# Patient Record
Sex: Female | Born: 1970 | Race: White | Hispanic: No | Smoking: Never smoker
Health system: Southern US, Community
[De-identification: ages and names within clinical notes are randomized; demographics above are authoritative.]

## PROBLEM LIST (undated history)

## (undated) DIAGNOSIS — R112 Nausea with vomiting, unspecified: Secondary | ICD-10-CM

## (undated) DIAGNOSIS — D3610 Benign neoplasm of peripheral nerves and autonomic nervous system, unspecified: Secondary | ICD-10-CM

## (undated) DIAGNOSIS — Z87442 Personal history of urinary calculi: Secondary | ICD-10-CM

## (undated) DIAGNOSIS — F4310 Post-traumatic stress disorder, unspecified: Secondary | ICD-10-CM

## (undated) DIAGNOSIS — E119 Type 2 diabetes mellitus without complications: Secondary | ICD-10-CM

## (undated) DIAGNOSIS — F32A Depression, unspecified: Secondary | ICD-10-CM

## (undated) DIAGNOSIS — F200 Paranoid schizophrenia: Secondary | ICD-10-CM

## (undated) DIAGNOSIS — G43909 Migraine, unspecified, not intractable, without status migrainosus: Secondary | ICD-10-CM

## (undated) DIAGNOSIS — F29 Unspecified psychosis not due to a substance or known physiological condition: Secondary | ICD-10-CM

## (undated) DIAGNOSIS — J45909 Unspecified asthma, uncomplicated: Secondary | ICD-10-CM

## (undated) DIAGNOSIS — F609 Personality disorder, unspecified: Secondary | ICD-10-CM

## (undated) DIAGNOSIS — Z9889 Other specified postprocedural states: Secondary | ICD-10-CM

## (undated) DIAGNOSIS — G919 Hydrocephalus, unspecified: Secondary | ICD-10-CM

## (undated) DIAGNOSIS — F319 Bipolar disorder, unspecified: Secondary | ICD-10-CM

## (undated) HISTORY — DX: Type 2 diabetes mellitus without complications: E11.9

## (undated) HISTORY — PX: TUBAL LIGATION: SHX77

---

## 2015-10-20 ENCOUNTER — Emergency Department (HOSPITAL_COMMUNITY): Payer: Medicaid Other

## 2015-10-20 ENCOUNTER — Emergency Department (HOSPITAL_COMMUNITY)
Admission: EM | Admit: 2015-10-20 | Discharge: 2015-10-20 | Disposition: A | Discharge status: Home / Self Care | Payer: Medicaid Other | Attending: Emergency Medicine | Admitting: Emergency Medicine

## 2015-10-20 ENCOUNTER — Encounter (HOSPITAL_COMMUNITY): Payer: Self-pay | Admitting: Emergency Medicine

## 2015-10-20 DIAGNOSIS — Z331 Pregnant state, incidental: Secondary | ICD-10-CM | POA: Diagnosis not present

## 2015-10-20 DIAGNOSIS — J45901 Unspecified asthma with (acute) exacerbation: Secondary | ICD-10-CM | POA: Diagnosis not present

## 2015-10-20 DIAGNOSIS — Z3A01 Less than 8 weeks gestation of pregnancy: Secondary | ICD-10-CM | POA: Insufficient documentation

## 2015-10-20 DIAGNOSIS — Z3201 Encounter for pregnancy test, result positive: Secondary | ICD-10-CM

## 2015-10-20 DIAGNOSIS — R0602 Shortness of breath: Secondary | ICD-10-CM | POA: Diagnosis present

## 2015-10-20 HISTORY — DX: Unspecified asthma, uncomplicated: J45.909

## 2015-10-20 LAB — CBC WITH DIFFERENTIAL/PLATELET
BASOS ABS: 0.1 10*3/uL (ref 0.0–0.1)
Basophils Relative: 0 %
EOS ABS: 0.5 10*3/uL (ref 0.0–0.7)
Eosinophils Relative: 4 %
HCT: 41.6 % (ref 36.0–46.0)
HEMOGLOBIN: 14 g/dL (ref 12.0–15.0)
LYMPHS ABS: 4.8 10*3/uL — AB (ref 0.7–4.0)
Lymphocytes Relative: 38 %
MCH: 29.8 pg (ref 26.0–34.0)
MCHC: 33.7 g/dL (ref 30.0–36.0)
MCV: 88.5 fL (ref 78.0–100.0)
Monocytes Absolute: 1.1 10*3/uL — ABNORMAL HIGH (ref 0.1–1.0)
Monocytes Relative: 9 %
NEUTROS PCT: 49 %
Neutro Abs: 6 10*3/uL (ref 1.7–7.7)
PLATELETS: 377 10*3/uL (ref 150–400)
RBC: 4.7 MIL/uL (ref 3.87–5.11)
RDW: 13.4 % (ref 11.5–15.5)
WBC: 12.5 10*3/uL — AB (ref 4.0–10.5)

## 2015-10-20 LAB — BLOOD GAS, VENOUS
ACID-BASE DEFICIT: 4.2 mmol/L — AB (ref 0.0–2.0)
Bicarbonate: 19.5 mEq/L — ABNORMAL LOW (ref 20.0–24.0)
O2 CONTENT: 2 L/min
O2 Saturation: 92.5 %
PATIENT TEMPERATURE: 37
PH VEN: 7.202 — AB (ref 7.250–7.300)
pCO2, Ven: 59.9 mmHg — ABNORMAL HIGH (ref 45.0–50.0)
pO2, Ven: 83.9 mmHg — ABNORMAL HIGH (ref 30.0–45.0)

## 2015-10-20 LAB — BLOOD GAS, ARTERIAL
ACID-BASE DEFICIT: 3.7 mmol/L — AB (ref 0.0–2.0)
Bicarbonate: 22.1 mEq/L (ref 20.0–24.0)
DRAWN BY: 277331
FIO2: 0.21
O2 SAT: 97.1 %
PATIENT TEMPERATURE: 37
pCO2 arterial: 27.1 mmHg — ABNORMAL LOW (ref 35.0–45.0)
pH, Arterial: 7.469 — ABNORMAL HIGH (ref 7.350–7.450)
pO2, Arterial: 91.2 mmHg (ref 80.0–100.0)

## 2015-10-20 LAB — BASIC METABOLIC PANEL
ANION GAP: 9 (ref 5–15)
BUN: 10 mg/dL (ref 6–20)
CHLORIDE: 103 mmol/L (ref 101–111)
CO2: 23 mmol/L (ref 22–32)
Calcium: 9 mg/dL (ref 8.9–10.3)
Creatinine, Ser: 0.75 mg/dL (ref 0.44–1.00)
Glucose, Bld: 177 mg/dL — ABNORMAL HIGH (ref 65–99)
POTASSIUM: 3.8 mmol/L (ref 3.5–5.1)
SODIUM: 135 mmol/L (ref 135–145)

## 2015-10-20 LAB — I-STAT BETA HCG BLOOD, ED (MC, WL, AP ONLY)

## 2015-10-20 MED ORDER — ALBUTEROL SULFATE HFA 108 (90 BASE) MCG/ACT IN AERS: 2.0000 | INHALATION_SPRAY | RESPIRATORY_TRACT | Status: AC | PRN

## 2015-10-20 MED ORDER — IPRATROPIUM-ALBUTEROL 0.5-2.5 (3) MG/3ML IN SOLN
RESPIRATORY_TRACT | Status: AC
  Administered 2015-10-20: 3 mL
  Filled 2015-10-20: qty 3

## 2015-10-20 MED ORDER — ALBUTEROL SULFATE HFA 108 (90 BASE) MCG/ACT IN AERS
2.0000 | INHALATION_SPRAY | Freq: Once | RESPIRATORY_TRACT | Status: AC
  Administered 2015-10-20: 2 via RESPIRATORY_TRACT
  Filled 2015-10-20: qty 6.7

## 2015-10-20 MED ORDER — METHYLPREDNISOLONE SODIUM SUCC 125 MG IJ SOLR
INTRAMUSCULAR | Status: AC
  Filled 2015-10-20: qty 2

## 2015-10-20 MED ORDER — ALBUTEROL SULFATE (2.5 MG/3ML) 0.083% IN NEBU
5.0000 mg | INHALATION_SOLUTION | Freq: Once | RESPIRATORY_TRACT | Status: DC
  Filled 2015-10-20: qty 6

## 2015-10-20 MED ORDER — IPRATROPIUM BROMIDE 0.02 % IN SOLN
0.5000 mg | Freq: Once | RESPIRATORY_TRACT | Status: DC
  Filled 2015-10-20: qty 2.5

## 2015-10-20 MED ORDER — PREDNISONE 10 MG PO TABS: 40.0000 mg | ORAL_TABLET | Freq: Every day | ORAL | Status: DC

## 2015-10-20 MED ORDER — METHYLPREDNISOLONE SODIUM SUCC 125 MG IJ SOLR
125.0000 mg | Freq: Once | INTRAMUSCULAR | Status: AC
  Administered 2015-10-20: 125 mg via INTRAVENOUS

## 2015-10-20 MED ORDER — MAGNESIUM SULFATE 2 GM/50ML IV SOLN
2.0000 g | Freq: Once | INTRAVENOUS | Status: AC
  Administered 2015-10-20: 2 g via INTRAVENOUS
  Filled 2015-10-20: qty 50

## 2015-10-20 MED ORDER — ALBUTEROL (5 MG/ML) CONTINUOUS INHALATION SOLN
10.0000 mg/h | INHALATION_SOLUTION | Freq: Once | RESPIRATORY_TRACT | Status: AC
  Administered 2015-10-20: 10 mg/h via RESPIRATORY_TRACT
  Filled 2015-10-20: qty 20

## 2015-10-20 NOTE — ED Notes (Signed)
PT presents to ED cold and clammy with SOB and wheezing inspiratory and expiratory. PT states hx of asthma but no inhaler and states is homeless. PT states tightness to chest. Respiratory paged.

## 2015-10-20 NOTE — ED Notes (Addendum)
Critical blood gas  Ph 7.20 C02:59.9 P02: 83.3 Bicarb: 19.5 Deficit: 4.2 Results reported to Dr. Oleta Mouse.

## 2015-10-20 NOTE — ED Notes (Signed)
MD at bedside. 

## 2015-10-20 NOTE — Discharge Instructions (Signed)
You can take albuterol every 3-4 hours as needed. Take your steroids as prescribed. Please return without fail for worsening symptoms including difficulty breathing, severe chest pain, passing out, or any other symptoms concerning to you.  Asthma, Adult Asthma is a condition of the lungs in which the airways tighten and narrow. Asthma can make it hard to breathe. Asthma cannot be cured, but medicine and lifestyle changes can help control it. Asthma may be started (triggered) by:  Animal skin flakes (dander).  Dust.  Cockroaches.  Pollen.  Mold.  Smoke.  Cleaning products.  Hair sprays or aerosol sprays.  Paint fumes or strong smells.  Cold air, weather changes, and winds.  Crying or laughing hard.  Stress.  Certain medicines or drugs.  Foods, such as dried fruit, potato chips, and sparkling grape juice.  Infections or conditions (colds, flu).  Exercise.  Certain medical conditions or diseases.  Exercise or tiring activities. HOME CARE   Take medicine as told by your doctor.  Use a peak flow meter as told by your doctor. A peak flow meter is a tool that measures how well the lungs are working.  Record and keep track of the peak flow meter's readings.  Understand and use the asthma action plan. An asthma action plan is a written plan for taking care of your asthma and treating your attacks.  To help prevent asthma attacks:  Do not smoke. Stay away from secondhand smoke.  Change your heating and air conditioning filter often.  Limit your use of fireplaces and wood stoves.  Get rid of pests (such as roaches and mice) and their droppings.  Throw away plants if you see mold on them.  Clean your floors. Dust regularly. Use cleaning products that do not smell.  Have someone vacuum when you are not home. Use a vacuum cleaner with a HEPA filter if possible.  Replace carpet with wood, tile, or vinyl flooring. Carpet can trap animal skin flakes and dust.  Use  allergy-proof pillows, mattress covers, and box spring covers.  Wash bed sheets and blankets every week in hot water and dry them in a dryer.  Use blankets that are made of polyester or cotton.  Clean bathrooms and kitchens with bleach. If possible, have someone repaint the walls in these rooms with mold-resistant paint. Keep out of the rooms that are being cleaned and painted.  Wash hands often. GET HELP IF:  You have make a whistling sound when breaking (wheeze), have shortness of breath, or have a cough even if taking medicine to prevent attacks.  The colored mucus you cough up (sputum) is thicker than usual.  The colored mucus you cough up changes from clear or white to yellow, green, gray, or bloody.  You have problems from the medicine you are taking such as:  A rash.  Itching.  Swelling.  Trouble breathing.  You need reliever medicines more than 2-3 times a week.  Your peak flow measurement is still at 50-79% of your personal best after following the action plan for 1 hour.  You have a fever. GET HELP RIGHT AWAY IF:   You seem to be worse and are not responding to medicine during an asthma attack.  You are short of breath even at rest.  You get short of breath when doing very little activity.  You have trouble eating, drinking, or talking.  You have chest pain.  You have a fast heartbeat.  Your lips or fingernails start to turn blue.  You  are light-headed, dizzy, or faint.  Your peak flow is less than 50% of your personal best.   This information is not intended to replace advice given to you by your health care provider. Make sure you discuss any questions you have with your health care provider.   Document Released: 04/14/2008 Document Revised: 07/18/2015 Document Reviewed: 05/26/2013 Elsevier Interactive Patient Education [employer].

## 2015-10-20 NOTE — ED Provider Notes (Signed)
CSN: [id number]     Arrival date & time 10/20/15  1318 History   First MD Initiated Contact with Patient 10/20/15 1342     Chief Complaint  Patient presents with  . Shortness of Breath     (Consider location/radiation/quality/duration/timing/severity/associated sxs/prior Treatment) HPI 44 year old female who presents with respiratory distress. History of asthma. History is provided by patient's significant other due to patient's respiratory distress. States that she has had some mild wheezing over the course of the past few days. Has had a recent cold, which she has now recovered from. After cleaning up in her room today, had progressively worsening shortness of breath and wheezing. Was brought to the ED for evaluation. No recent fevers, chills, chest pain, swelling in her legs or pain in her legs. Patient states that she does not have inhaler. Often do need steroids every 2-3 months. Has never needed hospitalization or intubations.   Past Medical History  Diagnosis Date  . Asthma    Past Surgical History  Procedure Laterality Date  . Cesarean section     History reviewed. No pertinent family history. Social History  Substance Use Topics  . Smoking status: Never Smoker   . Smokeless tobacco: None  . Alcohol Use: No   OB History    No data available     Review of Systems 10/14 systems reviewed and are negative other than those stated in the HPI    Allergies  Review of patient's allergies indicates no known allergies.  Home Medications   Prior to Admission medications   Medication Sig Start Date End Date Taking? Authorizing Provider  albuterol (PROVENTIL HFA;VENTOLIN HFA) 108 (90 BASE) MCG/ACT inhaler Inhale 2 puffs into the lungs every 4 (four) hours as needed for wheezing or shortness of breath. 10/20/15   Forde Dandy, MD  predniSONE (DELTASONE) 10 MG tablet Take 4 tablets (40 mg total) by mouth daily. 10/21/15   Forde Dandy, MD   BP 103/70 mmHg  Pulse 96   Temp(Src) 97.3 F (36.3 C) (Oral)  Resp 20  Ht 5\' 4"  (1.626 m)  Wt 240 lb (108.863 kg)  BMI 41.18 kg/m2  SpO2 96%  LMP 08/29/2015 Physical Exam Physical Exam  Nursing note and vitals reviewed. Constitutional: Diaphoretic, severe respiratory distress, ill appearing Head: Normocephalic and atraumatic.  Mouth/Throat: Oropharynx is clear and moist.  Neck: Normal range of motion. Neck supple.  Cardiovascular: Tachycardic rate and regular rhythm.  No edema.  Pulmonary/Chest: Severe respiratory distress, with inability to speak. Tachypnea, accessory muscle usage, poor aeration throughout, with scant wheezes due to poor air movement Abdominal: Soft. There is no tenderness. There is no rebound and no guarding.  Musculoskeletal: Normal range of motion.  Neurological: Alert, no facial droop, fluent speech, moves all extremities symmetrically Skin: Skin is warm and dry.  Psychiatric: Cooperative  ED Course  Procedures (including critical care time) Labs Review Labs Reviewed  BLOOD GAS, VENOUS - Abnormal; Notable for the following:    pH, Ven 7.202 (*)    pCO2, Ven 59.9 (*)    pO2, Ven 83.9 (*)    Bicarbonate 19.5 (*)    Acid-base deficit 4.2 (*)    All other components within normal limits  CBC WITH DIFFERENTIAL/PLATELET - Abnormal; Notable for the following:    WBC 12.5 (*)    Lymphs Abs 4.8 (*)    Monocytes Absolute 1.1 (*)    All other components within normal limits  BASIC METABOLIC PANEL - Abnormal; Notable for the following:  Glucose, Bld 177 (*)    All other components within normal limits  BLOOD GAS, ARTERIAL - Abnormal; Notable for the following:    pH, Arterial 7.469 (*)    pCO2 arterial 27.1 (*)    Acid-base deficit 3.7 (*)    All other components within normal limits  I-STAT BETA HCG BLOOD, ED ([facility], [facility], [facility] ONLY) - Abnormal; Notable for the following:    I-stat hCG, quantitative >2000.0 (*)    All other components within normal limits    Imaging Review Dg Chest 2  View  10/20/2015  CLINICAL DATA:  Shortness of breath and wheezing EXAM: CHEST  2 VIEW COMPARISON:  None. FINDINGS: Lungs are clear. Heart size and pulmonary vascularity are normal. No adenopathy. No bone lesions. IMPRESSION: No edema or consolidation. Electronically Signed   By: Lowella Grip III M.D.   On: 10/20/2015 15:15   I have personally reviewed and evaluated these images and lab results as part of my medical decision-making.   EKG Interpretation   Date/Time:  Saturday October 20 2015 13:39:22 EST Ventricular Rate:  126 PR Interval:  150 QRS Duration: 92 QT Interval:  309 QTC Calculation: 447 R Axis:   98 Text Interpretation:  Sinus tachycardia Probable left atrial enlargement  Borderline right axis deviation Minimal ST depression, inferior leads  Baseline wander No prior EKg for comparison Confirmed by LIU MD, DANA  [phone]) on 10/20/2015 1:43:22 PM       CRITICAL CARE Performed by: Forde Dandy   Total critical care time: 35 minutes  Critical care time was exclusive of separately billable procedures and treating other patients.  Critical care was necessary to treat or prevent imminent or life-threatening deterioration.  Critical care was time spent personally by me on the following activities: development of treatment plan with patient and/or surrogate as well as nursing, discussions with consultants, evaluation of patient's response to treatment, examination of patient, obtaining history from patient or surrogate, ordering and performing treatments and interventions, ordering and review of laboratory studies, ordering and review of radiographic studies, pulse oximetry and re-evaluation of patient's condition.   MDM   Final diagnoses:  Asthma exacerbation  Positive blood pregnancy test    In short, this is a 44 year old female with history of asthma who presents with severe respiratory distress. She on presentation is ill-appearing, diaphoretic, in severe  respiratory distress. He is unable to speak due to increased work of breathing, and overall has minimal breath sounds due with occasional expiratory wheeze. She is tachycardic and hypoxic on room air to 88%. Is given a duoneb followed by one hour of continuous albuterol. Received 2 g of magnesium sulfate and 125 mg of Solu-Medrol. Is noted to have initial significant improvement in symptoms after treatment with first duoneb, and during continuous albuterol is noted to have increased work of breathing, improvement in her conversational dyspnea, and improved air movement. CXR without infiltrate or other acute cardiopulmonary processes.  Initial VBG was concerning for a respiratory acidosis, but on my evaluation of her, she is alert and oriented, and showing response to treatment. An ABG was repeated shows more respiratory alkalosis from her tachypnea, and I'm not concerned for impending respiratory failure. She is incidentally found to be pregnant, as well, but no evidence of abdominal pain or vaginal bleeding. I do not feel that she requires imaging of her pregnancy today. She is given referral to [facility] for follow-up. Has clear lungs after 3 hour observation, with normal work of breathing and no  conversational dyspnea. Adequate for discharge home with outpatient treatment. If given albuterol inhaler as well as steroid burst. Low suspicion for PE despite pregnancy is full recovery with asthma treatments, and no other signs or symptoms of PE/DVT. Discussed strict return and follow-up instructions. She expressed understanding of all discharge instructions and felt comfortable to plan of care.    Forde Dandy, MD 10/20/15 [phone]

## 2015-10-20 NOTE — ED Notes (Signed)
Pt made aware to return if symptoms worsen or if any life threatening symptoms occur.   

## 2015-10-31 ENCOUNTER — Other Ambulatory Visit: Payer: Self-pay | Admitting: Obstetrics & Gynecology

## 2015-10-31 DIAGNOSIS — O3680X0 Pregnancy with inconclusive fetal viability, not applicable or unspecified: Secondary | ICD-10-CM

## 2015-11-01 ENCOUNTER — Ambulatory Visit (INDEPENDENT_AMBULATORY_CARE_PROVIDER_SITE_OTHER): Payer: Medicaid Other

## 2015-11-01 ENCOUNTER — Encounter: Payer: Self-pay | Admitting: *Deleted

## 2015-11-01 DIAGNOSIS — O3680X0 Pregnancy with inconclusive fetal viability, not applicable or unspecified: Secondary | ICD-10-CM | POA: Diagnosis not present

## 2015-11-01 DIAGNOSIS — Z3A01 Less than 8 weeks gestation of pregnancy: Secondary | ICD-10-CM | POA: Diagnosis not present

## 2015-11-01 NOTE — Progress Notes (Signed)
Dating [place] today.  Single IUP with CRL 6.9 mm which correlates to 6+[redacted] weeks GA.  FHR 121 bpm. Bilateral ovaries appear normal.

## 2015-11-01 NOTE — Progress Notes (Signed)
Patient ID: Sherry Cole, female   DOB: 26-Jan-1971, 44 y.o.   MRN: [id number] Pt here today for ultrasound only, had a few questions. Pt states she is having trouble with allergies what medication is safe at 6 weeks of pregnancy? Pt informed can take Tylenol or Benadryl. Pt states she is having to use and albuterol inhaler for wheezing frequently, hx of Asthma. Pt does not have cough or nasal drainage just wheezing. Pt also states she is homeless, lives in [place]. Asked pt if she had means of staying warm and food. Pt stated "working on that." Just moved to the area and is getting assistance. Informed pt if she had an episode of wheezing, unable to breath, and the albuterol inhaler was not affective she needed to go to ER. Pt verbalized understanding. Pt has her next appt on 11/14/2014 for New OB.

## 2015-11-15 ENCOUNTER — Other Ambulatory Visit (HOSPITAL_COMMUNITY)
Admission: RE | Admit: 2015-11-15 | Discharge: 2015-11-15 | Disposition: A | Discharge status: Home / Self Care | Payer: Medicaid Other | Source: Ambulatory Visit | Attending: Advanced Practice Midwife | Admitting: Advanced Practice Midwife

## 2015-11-15 ENCOUNTER — Encounter: Payer: Self-pay | Admitting: Advanced Practice Midwife

## 2015-11-15 ENCOUNTER — Ambulatory Visit (INDEPENDENT_AMBULATORY_CARE_PROVIDER_SITE_OTHER): Payer: Medicaid Other | Admitting: Advanced Practice Midwife

## 2015-11-15 VITALS — BP 100/60 | HR 76 | Wt 241.0 lb

## 2015-11-15 DIAGNOSIS — Z1151 Encounter for screening for human papillomavirus (HPV): Secondary | ICD-10-CM | POA: Diagnosis not present

## 2015-11-15 DIAGNOSIS — Z113 Encounter for screening for infections with a predominantly sexual mode of transmission: Secondary | ICD-10-CM | POA: Insufficient documentation

## 2015-11-15 DIAGNOSIS — O09519 Supervision of elderly primigravida, unspecified trimester: Secondary | ICD-10-CM | POA: Insufficient documentation

## 2015-11-15 DIAGNOSIS — Z0283 Encounter for blood-alcohol and blood-drug test: Secondary | ICD-10-CM

## 2015-11-15 DIAGNOSIS — Z331 Pregnant state, incidental: Secondary | ICD-10-CM

## 2015-11-15 DIAGNOSIS — Z01419 Encounter for gynecological examination (general) (routine) without abnormal findings: Secondary | ICD-10-CM | POA: Insufficient documentation

## 2015-11-15 DIAGNOSIS — Z369 Encounter for antenatal screening, unspecified: Secondary | ICD-10-CM

## 2015-11-15 DIAGNOSIS — O09521 Supervision of elderly multigravida, first trimester: Secondary | ICD-10-CM

## 2015-11-15 DIAGNOSIS — Z1389 Encounter for screening for other disorder: Secondary | ICD-10-CM

## 2015-11-15 DIAGNOSIS — O34219 Maternal care for unspecified type scar from previous cesarean delivery: Secondary | ICD-10-CM | POA: Insufficient documentation

## 2015-11-15 DIAGNOSIS — Z124 Encounter for screening for malignant neoplasm of cervix: Secondary | ICD-10-CM

## 2015-11-15 LAB — POCT URINALYSIS DIPSTICK
Glucose, UA: NEGATIVE
KETONES UA: NEGATIVE
Leukocytes, UA: NEGATIVE
Nitrite, UA: NEGATIVE
PROTEIN UA: NEGATIVE
RBC UA: NEGATIVE

## 2015-11-15 NOTE — Progress Notes (Signed)
  Subjective:    Sherry Cole is a G2P0 [redacted]w[redacted]d being seen today for her first obstetrical visit.  Her obstetrical history is significant for advanced maternal age.  Pregnancy history fully reviewed. She had a cs after failed IOL for PROM--wants repeat.  She lived in [place], bought a RV for $40 in [place] she, her husband, and her son have traveled since then, now plan to stay in [place]. Still living in the RV, wants to get a job.  Somewhat of a poor historian regarding health history "pregnant [redacted] weeks--EDC based on LMP rather than my pregnancy sx", "they always find cancer cells in my paps, but I never go back cause I know they weren't in my cervix."   Patient reports some fatigue and dizzy spells. Is dizzy sometime even when supine, has had vertigo in the past.  Dix-Hallpike test negative  Filed Vitals:   11/15/15 0848  BP: 100/60  Pulse: 76  Weight: 241 lb (109.317 kg)    HISTORY: OB History  Gravida Para Term Preterm AB SAB TAB Ectopic Multiple Living  2 1 1       1     # Outcome Date GA Lbr Len/2nd Weight Sex Delivery Anes PTL Lv  2 Current           1 Term 08/23/02 [redacted]w[redacted]d  10 lb (4.536 kg) M CS-LTranv Spinal N Y     Past Medical History  Diagnosis Date  . Asthma    Past Surgical History  Procedure Laterality Date  . Cesarean section     Family History  Problem Relation Age of Onset  . Heart disease Mother   . Heart disease Maternal Grandmother   . Cancer Maternal Grandmother   . Hyperlipidemia Maternal Grandmother      Exam       Pelvic Exam:    Perineum: Normal Perineum   Vulva: normal   Vagina:  normal mucosa, normal discharge, no palpable nodules   Uterus Normal, Gravid, FH: 8     Cervix: Normal.,  Pap collected from cervix   Adnexa: Not palpable   Urinary:  urethral meatus normal    System:     Skin: normal coloration and turgor, no rashes    Neurologic: oriented, normal, normal mood   Extremities: normal strength, tone, and muscle mass   HEENT  PERRLA   Mouth/Teeth mucous membranes moist, poor dentition   Neck supple and no masses   Cardiovascular: regular rate and rhythm   Respiratory:  appears well, vitals normal, no respiratory distress, acyanotic   Abdomen: soft, non-tender;  FHR: 150 [place]          Assessment:    Pregnancy: G2P0  AMA       Plan:     Initial labs drawn. Continue prenatal vitamins  Problem list reviewed and updated  Reviewed n/v relief measures and warning s/s to report  Reviewed recommended weight gain based on pre-gravid BMI  Encouraged well-balanced diet Genetic Screening discussed    : CFDNA vs amnio--requests CFDNA.  Ultrasound discussed; fetal survey: requested.  Return in about 4 weeks (around 12/13/2015) for HROB., CFDNA, start ASA 81mg   CRESENZO-DISHMAN,Arlie Posch 11/15/2015

## 2015-11-15 NOTE — Patient Instructions (Addendum)
Safe Medications in Pregnancy   Acne: Benzoyl Peroxide Salicylic Acid  Backache/Headache: Tylenol: 2 regular strength every 4 hours OR              2 Extra strength every 6 hours  Colds/Coughs/Allergies: Benadryl (alcohol free) 25 mg every 6 hours as needed Breath right strips Claritin Cepacol throat lozenges Chloraseptic throat spray Cold-Eeze- up to three times per day Cough drops, alcohol free Flonase (by prescription only) Guaifenesin Mucinex Robitussin DM (plain only, alcohol free) Saline nasal spray/drops Sudafed (pseudoephedrine) & Actifed ** use only after [redacted] weeks gestation and if you do not have high blood pressure Tylenol Vicks Vaporub Zinc lozenges Zyrtec   Constipation: Colace Ducolax suppositories Fleet enema Glycerin suppositories Metamucil Milk of magnesia Miralax Senokot Smooth move tea  Diarrhea: Kaopectate Imodium A-D  *NO pepto Bismol  Hemorrhoids: Anusol Anusol HC Preparation H Tucks  Indigestion: Tums Maalox Mylanta Zantac  Pepcid  Insomnia: Benadryl (alcohol free) 25mg every 6 hours as needed Tylenol PM Unisom, no Gelcaps  Leg Cramps: Tums MagGel  Nausea/Vomiting:  Bonine Dramamine Emetrol Ginger extract Sea bands Meclizine  Nausea medication to take during pregnancy:  Unisom (doxylamine succinate 25 mg tablets) Take one tablet daily at bedtime. If symptoms are not adequately controlled, the dose can be increased to a maximum recommended dose of two tablets daily (1/2 tablet in the morning, 1/2 tablet mid-afternoon and one at bedtime). Vitamin B6 100mg tablets. Take one tablet twice a day (up to 200 mg per day).  Skin Rashes: Aveeno products Benadryl cream or 25mg every 6 hours as needed Calamine Lotion 1% cortisone cream  Yeast infection: Gyne-lotrimin 7 Monistat 7   **If taking multiple medications, please check labels to avoid duplicating the same active ingredients **take medication as directed on  the label ** Do not exceed 4000 mg of tylenol in 24 hours **Do not take medications that contain aspirin or ibuprofen    First Trimester of Pregnancy The first trimester of pregnancy is from week 1 until the end of week 12 (months 1 through 3). A week after a sperm fertilizes an egg, the egg will implant on the wall of the uterus. This embryo will begin to develop into a baby. Genes from you and your partner are forming the baby. The female genes determine whether the baby is a boy or a girl. At 6-8 weeks, the eyes and face are formed, and the heartbeat can be seen on ultrasound. At the end of 12 weeks, all the baby's organs are formed.  Now that you are pregnant, you will want to do everything you can to have a healthy baby. Two of the most important things are to get good prenatal care and to follow your health care provider's instructions. Prenatal care is all the medical care you receive before the baby's birth. This care will help prevent, find, and treat any problems during the pregnancy and childbirth. BODY CHANGES Your body goes through many changes during pregnancy. The changes vary from woman to woman.   You may gain or lose a couple of pounds at first.  You may feel sick to your stomach (nauseous) and throw up (vomit). If the vomiting is uncontrollable, call your health care provider.  You may tire easily.  You may develop headaches that can be relieved by medicines approved by your health care provider.  You may urinate more often. Painful urination may mean you have a bladder infection.  You may develop heartburn as a result of your   pregnancy.  You may develop constipation because certain hormones are causing the muscles that push waste through your intestines to slow down.  You may develop hemorrhoids or swollen, bulging veins (varicose veins).  Your breasts may begin to grow larger and become tender. Your nipples may stick out more, and the tissue that surrounds them (areola)  may become darker.  Your gums may bleed and may be sensitive to brushing and flossing.  Dark spots or blotches (chloasma, mask of pregnancy) may develop on your face. This will likely fade after the baby is born.  Your menstrual periods will stop.  You may have a loss of appetite.  You may develop cravings for certain kinds of food.  You may have changes in your emotions from day to day, such as being excited to be pregnant or being concerned that something may go wrong with the pregnancy and baby.  You may have more vivid and strange dreams.  You may have changes in your hair. These can include thickening of your hair, rapid growth, and changes in texture. Some women also have hair loss during or after pregnancy, or hair that feels dry or thin. Your hair will most likely return to normal after your baby is born. WHAT TO EXPECT AT YOUR PRENATAL VISITS During a routine prenatal visit:  You will be weighed to make sure you and the baby are growing normally.  Your blood pressure will be taken.  Your abdomen will be measured to track your baby's growth.  The fetal heartbeat will be listened to starting around week 10 or 12 of your pregnancy.  Test results from any previous visits will be discussed. Your health care provider may ask you:  How you are feeling.  If you are feeling the baby move.  If you have had any abnormal symptoms, such as leaking fluid, bleeding, severe headaches, or abdominal cramping.  If you are using any tobacco products, including cigarettes, chewing tobacco, and electronic cigarettes.  If you have any questions. Other tests that may be performed during your first trimester include:  Blood tests to find your blood type and to check for the presence of any previous infections. They will also be used to check for low iron levels (anemia) and Rh antibodies. Later in the pregnancy, blood tests for diabetes will be done along with other tests if problems  develop.  Urine tests to check for infections, diabetes, or protein in the urine.  An ultrasound to confirm the proper growth and development of the baby.  An amniocentesis to check for possible genetic problems.  Fetal screens for spina bifida and Down syndrome.  You may need other tests to make sure you and the baby are doing well.  HIV (human immunodeficiency virus) testing. Routine prenatal testing includes screening for HIV, unless you choose not to have this test. HOME CARE INSTRUCTIONS  Medicines  Follow your health care provider's instructions regarding medicine use. Specific medicines may be either safe or unsafe to take during pregnancy.  Take your prenatal vitamins as directed.  If you develop constipation, try taking a stool softener if your health care provider approves. Diet  Eat regular, well-balanced meals. Choose a variety of foods, such as meat or vegetable-based protein, fish, milk and low-fat dairy products, vegetables, fruits, and whole grain breads and cereals. Your health care provider will help you determine the amount of weight gain that is right for you.  Avoid raw meat and uncooked cheese. These carry germs that can cause   birth defects in the baby.  Eating four or five small meals rather than three large meals a day may help relieve nausea and vomiting. If you start to feel nauseous, eating a few soda crackers can be helpful. Drinking liquids between meals instead of during meals also seems to help nausea and vomiting.  If you develop constipation, eat more high-fiber foods, such as fresh vegetables or fruit and whole grains. Drink enough fluids to keep your urine clear or pale yellow. Activity and Exercise  Exercise only as directed by your health care provider. Exercising will help you:  Control your weight.  Stay in shape.  Be prepared for labor and delivery.  Experiencing pain or cramping in the lower abdomen or low back is a good sign that you  should stop exercising. Check with your health care provider before continuing normal exercises.  Try to avoid standing for long periods of time. Move your legs often if you must stand in one place for a long time.  Avoid heavy lifting.  Wear low-heeled shoes, and practice good posture.  You may continue to have sex unless your health care provider directs you otherwise. Relief of Pain or Discomfort  Wear a good support bra for breast tenderness.   Take warm sitz baths to soothe any pain or discomfort caused by hemorrhoids. Use hemorrhoid cream if your health care provider approves.   Rest with your legs elevated if you have leg cramps or low back pain.  If you develop varicose veins in your legs, wear support hose. Elevate your feet for 15 minutes, 3-4 times a day. Limit salt in your diet. Prenatal Care  Schedule your prenatal visits by the twelfth week of pregnancy. They are usually scheduled monthly at first, then more often in the last 2 months before delivery.  Write down your questions. Take them to your prenatal visits.  Keep all your prenatal visits as directed by your health care provider. Safety  Wear your seat belt at all times when driving.  Make a list of emergency phone numbers, including numbers for family, friends, the hospital, and police and fire departments. General Tips  Ask your health care provider for a referral to a local prenatal education class. Begin classes no later than at the beginning of month 6 of your pregnancy.  Ask for help if you have counseling or nutritional needs during pregnancy. Your health care provider can offer advice or refer you to specialists for help with various needs.  Do not use hot tubs, steam rooms, or saunas.  Do not douche or use tampons or scented sanitary pads.  Do not cross your legs for long periods of time.  Avoid cat litter boxes and soil used by cats. These carry germs that can cause birth defects in the baby  and possibly loss of the fetus by miscarriage or stillbirth.  Avoid all smoking, herbs, alcohol, and medicines not prescribed by your health care provider. Chemicals in these affect the formation and growth of the baby.  Do not use any tobacco products, including cigarettes, chewing tobacco, and electronic cigarettes. If you need help quitting, ask your health care provider. You may receive counseling support and other resources to help you quit.  Schedule a dentist appointment. At home, brush your teeth with a soft toothbrush and be gentle when you floss. SEEK MEDICAL CARE IF:   You have dizziness.  You have mild pelvic cramps, pelvic pressure, or nagging pain in the abdominal area.  You have persistent   nausea, vomiting, or diarrhea.  You have a bad smelling vaginal discharge.  You have pain with urination.  You notice increased swelling in your face, hands, legs, or ankles. SEEK IMMEDIATE MEDICAL CARE IF:   You have a fever.  You are leaking fluid from your vagina.  You have spotting or bleeding from your vagina.  You have severe abdominal cramping or pain.  You have rapid weight gain or loss.  You vomit blood or material that looks like coffee grounds.  You are exposed to German measles and have never had them.  You are exposed to fifth disease or chickenpox.  You develop a severe headache.  You have shortness of breath.  You have any kind of trauma, such as from a fall or a car accident.   This information is not intended to replace advice given to you by your health care provider. Make sure you discuss any questions you have with your health care provider.   Document Released: 10/21/2001 Document Revised: 11/17/2014 Document Reviewed: 09/06/2013 Elsevier Interactive Patient Education 2016 [employer]  

## 2015-11-16 LAB — URINE CULTURE: Organism ID, Bacteria: NO GROWTH

## 2015-11-19 LAB — CYTOLOGY - PAP

## 2015-11-26 LAB — CBC
HEMATOCRIT: 38.5 % (ref 34.0–46.6)
Hemoglobin: 13.1 g/dL (ref 11.1–15.9)
MCH: 29.3 pg (ref 26.6–33.0)
MCHC: 34 g/dL (ref 31.5–35.7)
MCV: 86 fL (ref 79–97)
Platelets: 363 10*3/uL (ref 150–379)
RBC: 4.47 x10E6/uL (ref 3.77–5.28)
RDW: 13.3 % (ref 12.3–15.4)
WBC: 11.1 10*3/uL — AB (ref 3.4–10.8)

## 2015-11-26 LAB — PMP SCREEN PROFILE (10S), URINE
AMPHETAMINE SCRN UR: NEGATIVE ng/mL
BARBITURATE SCRN UR: NEGATIVE ng/mL
Benzodiazepine Screen, Urine: NEGATIVE ng/mL
CREATININE(CRT), U: 71.4 mg/dL (ref 20.0–300.0)
Cannabinoids Ur Ql Scn: NEGATIVE ng/mL
Cocaine(Metab.)Screen, Urine: NEGATIVE ng/mL
METHADONE SCREEN, URINE: NEGATIVE ng/mL
OPIATE SCRN UR: NEGATIVE ng/mL
OXYCODONE+OXYMORPHONE UR QL SCN: NEGATIVE ng/mL
PCP Scrn, Ur: NEGATIVE ng/mL
PROPOXYPHENE SCREEN: NEGATIVE ng/mL
Ph of Urine: 6.2 (ref 4.5–8.9)

## 2015-11-26 LAB — URINALYSIS, ROUTINE W REFLEX MICROSCOPIC
BILIRUBIN UA: NEGATIVE
GLUCOSE, UA: NEGATIVE
Ketones, UA: NEGATIVE
LEUKOCYTES UA: NEGATIVE
Nitrite, UA: NEGATIVE
PH UA: 6.5 (ref 5.0–7.5)
PROTEIN UA: NEGATIVE
RBC, UA: NEGATIVE
Specific Gravity, UA: 1.012 (ref 1.005–1.030)
Urobilinogen, Ur: 0.2 mg/dL (ref 0.2–1.0)

## 2015-11-26 LAB — RUBELLA SCREEN

## 2015-11-26 LAB — ABO/RH: Rh Factor: POSITIVE

## 2015-11-26 LAB — HIV ANTIBODY (ROUTINE TESTING W REFLEX): HIV SCREEN 4TH GENERATION: NONREACTIVE

## 2015-11-26 LAB — HEPATITIS B SURFACE ANTIGEN: Hepatitis B Surface Ag: NEGATIVE

## 2015-11-26 LAB — CYSTIC FIBROSIS MUTATION 97: GENE DIS ANAL CARRIER INTERP BLD/T-IMP: NOT DETECTED

## 2015-11-26 LAB — VARICELLA ZOSTER ANTIBODY, IGG: Varicella zoster IgG: <135 index — ABNORMAL LOW (ref >165)

## 2015-11-26 LAB — ANTIBODY SCREEN: ANTIBODY SCREEN: NEGATIVE

## 2015-11-26 LAB — RPR: RPR: NONREACTIVE

## 2015-11-29 ENCOUNTER — Encounter: Payer: Self-pay | Admitting: Advanced Practice Midwife

## 2015-11-29 DIAGNOSIS — O09899 Supervision of other high risk pregnancies, unspecified trimester: Secondary | ICD-10-CM | POA: Insufficient documentation

## 2015-12-13 ENCOUNTER — Encounter: Payer: Self-pay | Admitting: Obstetrics & Gynecology

## 2015-12-13 ENCOUNTER — Ambulatory Visit (INDEPENDENT_AMBULATORY_CARE_PROVIDER_SITE_OTHER): Payer: Medicaid Other | Admitting: Obstetrics & Gynecology

## 2015-12-13 VITALS — BP 100/60 | HR 60 | Wt 242.0 lb

## 2015-12-13 DIAGNOSIS — Z3A13 13 weeks gestation of pregnancy: Secondary | ICD-10-CM

## 2015-12-13 DIAGNOSIS — Z1389 Encounter for screening for other disorder: Secondary | ICD-10-CM

## 2015-12-13 DIAGNOSIS — Z3492 Encounter for supervision of normal pregnancy, unspecified, second trimester: Secondary | ICD-10-CM

## 2015-12-13 DIAGNOSIS — Z331 Pregnant state, incidental: Secondary | ICD-10-CM

## 2015-12-13 DIAGNOSIS — Z3481 Encounter for supervision of other normal pregnancy, first trimester: Secondary | ICD-10-CM

## 2015-12-13 DIAGNOSIS — O09521 Supervision of elderly multigravida, first trimester: Secondary | ICD-10-CM

## 2015-12-13 LAB — POCT URINALYSIS DIPSTICK
GLUCOSE UA: NEGATIVE
KETONES UA: NEGATIVE
Leukocytes, UA: NEGATIVE
Nitrite, UA: NEGATIVE
Protein, UA: NEGATIVE
RBC UA: NEGATIVE

## 2015-12-13 MED ORDER — ASPIRIN 81 MG PO CHEW: 81.0000 mg | CHEWABLE_TABLET | Freq: Every day | ORAL | Status: DC

## 2015-12-13 MED ORDER — PRENATAL 27-0.8 MG PO TABS: 1.0000 | ORAL_TABLET | Freq: Every day | ORAL | Status: DC

## 2015-12-13 NOTE — Progress Notes (Signed)
G2P1001 [redacted]w[redacted]d Estimated Date of Delivery: 06/23/16  Blood pressure 100/60, pulse 60, weight 242 lb (109.77 kg), last menstrual period 08/29/2015.   BP weight and urine results all reviewed and noted.  Please refer to the obstetrical flow sheet for the fundal height and fetal heart rate documentation:  Patient reports good fetal movement, denies any bleeding and no rupture of membranes symptoms or regular contractions. Patient is without complaints. All questions were answered.  Orders Placed This Encounter  Procedures  . informaSeq(SM) Prenatal Test  . POCT urinalysis dipstick    Plan:  Continued routine obstetrical care, cfDNA today  Return in about 4 weeks (around 01/10/2016) for LROB.

## 2015-12-17 ENCOUNTER — Telehealth: Payer: Self-pay | Admitting: *Deleted

## 2015-12-18 NOTE — Telephone Encounter (Signed)
Sherry Cole with [facility] requesting gestational age at 12/13/2015 labs done. Informed pt was 12 weeks and 3 days on 12/13/2015 when lab was done.

## 2015-12-19 LAB — INFORMASEQ(SM) PRENATAL TEST
Fetal Fraction (%):: 4.8
Gestational Age at Collection: 12.3 weeks

## 2016-01-10 ENCOUNTER — Ambulatory Visit (INDEPENDENT_AMBULATORY_CARE_PROVIDER_SITE_OTHER): Payer: Medicaid Other | Admitting: Advanced Practice Midwife

## 2016-01-10 VITALS — BP 110/60 | HR 80 | Wt 247.0 lb

## 2016-01-10 DIAGNOSIS — J45909 Unspecified asthma, uncomplicated: Secondary | ICD-10-CM | POA: Insufficient documentation

## 2016-01-10 DIAGNOSIS — O09892 Supervision of other high risk pregnancies, second trimester: Secondary | ICD-10-CM

## 2016-01-10 DIAGNOSIS — Z331 Pregnant state, incidental: Secondary | ICD-10-CM

## 2016-01-10 DIAGNOSIS — Z1389 Encounter for screening for other disorder: Secondary | ICD-10-CM

## 2016-01-10 DIAGNOSIS — Z363 Encounter for antenatal screening for malformations: Secondary | ICD-10-CM

## 2016-01-10 DIAGNOSIS — O09522 Supervision of elderly multigravida, second trimester: Secondary | ICD-10-CM

## 2016-01-10 LAB — POCT URINALYSIS DIPSTICK
Blood, UA: NEGATIVE
Glucose, UA: NEGATIVE
KETONES UA: NEGATIVE
LEUKOCYTES UA: NEGATIVE
Nitrite, UA: NEGATIVE

## 2016-01-10 MED ORDER — AMOXICILLIN 500 MG PO CAPS: 500.0000 mg | ORAL_CAPSULE | Freq: Three times a day (TID) | ORAL | Status: DC

## 2016-01-10 MED ORDER — PREDNISONE 20 MG PO TABS: 40.0000 mg | ORAL_TABLET | Freq: Every day | ORAL | Status: DC

## 2016-01-10 NOTE — Progress Notes (Signed)
Fetal Surveillance Testing today:  ddoppler   High Risk Pregnancy Diagnosis(es):   AMA 40+  G2P1001 [redacted]w[redacted]d Estimated Date of Delivery: 06/23/16  Last menstrual period 08/29/2015.  Urinalysis: Negative   HPI: The patient is being seen today for ongoing management of pregnancy, AMA. Today she reports C/O having to use inhaler for asthma 6-7 times a day.  States usually responds well to a short course of steroids, and would rather try that vs to on daily med.  C/O chronic ear infections since childhood.  Left ear has been bothering her for a week or so, some right ear pain.   BP weight and urine results all reviewed and noted. Lungs CTAB, just used inhaler, was wheezing before L ear erythemous, bulging TM.  R ear looks fine Patient reports good fetal movement, denies any bleeding and no rupture of membranes symptoms or regular contractions.  Fundal Height:  16 Fetal Heart rate:  150 Edema:  no  Patient is without complaints other than noted in her HPI. All questions were answered.  All lab and sonogram results have been reviewed. Comments:  n/a   Assessment:  1.  Pregnancy at [redacted]w[redacted]d,  Estimated Date of Delivery: 06/23/16 :  AMA                        2.  Left ear infection                        3.  Asthma exacerbation  Medication(s) Plans:   Meds ordered this encounter  Medications  . acetaminophen (TYLENOL) 500 MG tablet    Sig: Take 500 mg by mouth every 6 (six) hours as needed.  . predniSONE (DELTASONE) 20 MG tablet    Sig: Take 2 tablets (40 mg total) by mouth daily with breakfast.    Dispense:  20 tablet    Refill:  0    Order Specific Question:  Supervising Provider    Answer:  Elonda Husky, LUTHER H [[id number]]  . amoxicillin (AMOXIL) 500 MG capsule    Sig: Take 1 capsule (500 mg total) by mouth 3 (three) times daily.    Dispense:  21 capsule    Refill:  0    Order Specific Question:  Supervising Provider    Answer:  Florian Buff [[id number]]      Return in about 3 weeks (around  01/31/2016) for [id number], [place]. for appointment for high risk OB care  Meds ordered this encounter  Medications  . acetaminophen (TYLENOL) 500 MG tablet    Sig: Take 500 mg by mouth every 6 (six) hours as needed.   Orders Placed This Encounter  Procedures  . US OB Comp + 14 Wk  . POCT urinalysis dipstick

## 2016-01-17 ENCOUNTER — Emergency Department (HOSPITAL_COMMUNITY)
Admission: EM | Admit: 2016-01-17 | Discharge: 2016-01-17 | Disposition: A | Discharge status: Home / Self Care | Payer: Medicaid Other | Attending: Emergency Medicine | Admitting: Emergency Medicine

## 2016-01-17 ENCOUNTER — Encounter (HOSPITAL_COMMUNITY): Payer: Self-pay | Admitting: *Deleted

## 2016-01-17 ENCOUNTER — Emergency Department (HOSPITAL_COMMUNITY): Payer: Medicaid Other

## 2016-01-17 DIAGNOSIS — J45909 Unspecified asthma, uncomplicated: Secondary | ICD-10-CM | POA: Diagnosis not present

## 2016-01-17 DIAGNOSIS — Z792 Long term (current) use of antibiotics: Secondary | ICD-10-CM | POA: Diagnosis not present

## 2016-01-17 DIAGNOSIS — Y92046 Garden or yard of boarding-house as the place of occurrence of the external cause: Secondary | ICD-10-CM | POA: Diagnosis not present

## 2016-01-17 DIAGNOSIS — X58XXXA Exposure to other specified factors, initial encounter: Secondary | ICD-10-CM | POA: Diagnosis not present

## 2016-01-17 DIAGNOSIS — O9A212 Injury, poisoning and certain other consequences of external causes complicating pregnancy, second trimester: Secondary | ICD-10-CM | POA: Insufficient documentation

## 2016-01-17 DIAGNOSIS — S8391XA Sprain of unspecified site of right knee, initial encounter: Secondary | ICD-10-CM | POA: Insufficient documentation

## 2016-01-17 DIAGNOSIS — Y9301 Activity, walking, marching and hiking: Secondary | ICD-10-CM | POA: Insufficient documentation

## 2016-01-17 DIAGNOSIS — Y999 Unspecified external cause status: Secondary | ICD-10-CM | POA: Insufficient documentation

## 2016-01-17 DIAGNOSIS — Z7982 Long term (current) use of aspirin: Secondary | ICD-10-CM | POA: Insufficient documentation

## 2016-01-17 DIAGNOSIS — Z3A17 17 weeks gestation of pregnancy: Secondary | ICD-10-CM | POA: Diagnosis not present

## 2016-01-17 DIAGNOSIS — Z79899 Other long term (current) drug therapy: Secondary | ICD-10-CM | POA: Diagnosis not present

## 2016-01-17 MED ORDER — HYDROCODONE-ACETAMINOPHEN 5-325 MG PO TABS: 1.0000 | ORAL_TABLET | Freq: Every evening | ORAL | Status: DC | PRN

## 2016-01-17 NOTE — ED Notes (Signed)
Patient with no complaints at this time. Respirations even and unlabored. Skin warm/dry. Discharge instructions reviewed with patient at this time. Patient given opportunity to voice concerns/ask questions. Patient discharged at this time and left Emergency Department with steady gait.   

## 2016-01-17 NOTE — ED Notes (Signed)
[id number]

## 2016-01-17 NOTE — Discharge Instructions (Signed)
Knee Sprain A knee sprain is a tear in one of the strong, fibrous tissues that connect the bones (ligaments) in your knee. The severity of the sprain depends on how much of the ligament is torn. The tear can be either partial or complete. CAUSES  Often, sprains are a result of a fall or injury. The force of the impact causes the fibers of your ligament to stretch too much. This excess tension causes the fibers of your ligament to tear. SIGNS AND SYMPTOMS  You may have some loss of motion in your knee. Other symptoms include:  Bruising.  Pain in the knee area.  Tenderness of the knee to the touch.  Swelling. DIAGNOSIS  To diagnose a knee sprain, your health care provider will physically examine your knee. Your health care provider may also suggest an X-ray exam of your knee to make sure no bones are broken. TREATMENT  If your ligament is only partially torn, treatment usually involves keeping the knee in a fixed position (immobilization) or bracing your knee for activities that require movement for several weeks. To do this, your health care provider will apply a bandage, cast, or splint to keep your knee from moving and to support your knee during movement until it heals. For a partially torn ligament, the healing process usually takes 4-6 weeks. If your ligament is completely torn, depending on which ligament it is, you may need surgery to reconnect the ligament to the bone or reconstruct it. After surgery, a cast or splint may be applied and will need to stay on your knee for 4-6 weeks while your ligament heals. HOME CARE INSTRUCTIONS  Keep your injured knee elevated to decrease swelling.  To ease pain and swelling, apply ice to the injured area:  Put ice in a plastic bag.  Place a towel between your skin and the bag.  Leave the ice on for 20 minutes, 2-3 times a day.  Only take medicine for pain as directed by your health care provider.  Do not leave your knee unprotected until  pain and stiffness go away (usually 4-6 weeks).  If you have a cast or splint, do not allow it to get wet. If you have been instructed not to remove it, cover it with a plastic bag when you shower or bathe. Do not swim.  Your health care provider may suggest exercises for you to do during your recovery to prevent or limit permanent weakness and stiffness. SEEK IMMEDIATE MEDICAL CARE IF:  Your cast or splint becomes damaged.  Your pain becomes worse.  You have significant pain, swelling, or numbness below the cast or splint. MAKE SURE YOU:  Understand these instructions.  Will watch your condition.  Will get help right away if you are not doing well or get worse.   This information is not intended to replace advice given to you by your health care provider. Make sure you discuss any questions you have with your health care provider.   Document Released: 10/27/2005 Document Revised: 11/17/2014 Document Reviewed: 06/08/2013 Elsevier Interactive Patient Education 2016 [place] may take the hydrocodone prescribed for pain relief at bedtime.   This will make you drowsy - do not drive within 4 hours of taking this medication.

## 2016-01-17 NOTE — ED Notes (Signed)
Pt comes in with knee injury to right knee. Pt is also pregnant, 17 weeks.
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
## 2016-01-31 ENCOUNTER — Ambulatory Visit (INDEPENDENT_AMBULATORY_CARE_PROVIDER_SITE_OTHER): Payer: Medicaid Other | Admitting: Advanced Practice Midwife

## 2016-01-31 ENCOUNTER — Ambulatory Visit (INDEPENDENT_AMBULATORY_CARE_PROVIDER_SITE_OTHER): Payer: Medicaid Other

## 2016-01-31 VITALS — BP 124/70 | HR 92 | Wt 249.0 lb

## 2016-01-31 DIAGNOSIS — O09892 Supervision of other high risk pregnancies, second trimester: Secondary | ICD-10-CM

## 2016-01-31 DIAGNOSIS — O09512 Supervision of elderly primigravida, second trimester: Secondary | ICD-10-CM

## 2016-01-31 DIAGNOSIS — Z36 Encounter for antenatal screening of mother: Secondary | ICD-10-CM

## 2016-01-31 DIAGNOSIS — Z363 Encounter for antenatal screening for malformations: Secondary | ICD-10-CM

## 2016-01-31 DIAGNOSIS — Z1389 Encounter for screening for other disorder: Secondary | ICD-10-CM

## 2016-01-31 DIAGNOSIS — O34219 Maternal care for unspecified type scar from previous cesarean delivery: Secondary | ICD-10-CM

## 2016-01-31 DIAGNOSIS — Z331 Pregnant state, incidental: Secondary | ICD-10-CM

## 2016-01-31 LAB — POCT URINALYSIS DIPSTICK
GLUCOSE UA: NEGATIVE
Ketones, UA: NEGATIVE
Leukocytes, UA: NEGATIVE
Nitrite, UA: NEGATIVE
RBC UA: NEGATIVE

## 2016-01-31 MED ORDER — BUDESONIDE-FORMOTEROL FUMARATE 80-4.5 MCG/ACT IN AERO: 2.0000 | INHALATION_SPRAY | Freq: Two times a day (BID) | RESPIRATORY_TRACT | Status: DC

## 2016-01-31 MED ORDER — BUTALBITAL-APAP-CAFFEINE 50-325-40 MG PO TABS: 1.0000 | ORAL_TABLET | Freq: Four times a day (QID) | ORAL | Status: DC | PRN
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
## 2016-01-31 NOTE — Progress Notes (Signed)
[place] 123456 wks,cephalic,post pl gr 0,normal ov's bilat,fhr 162 bpm,cx 4.2 cm,efw 327 g,measurements c/w dates,limited view of heart and nose/lip,please have pt come back for additional images

## 2016-02-08 ENCOUNTER — Telehealth: Payer: Self-pay | Admitting: *Deleted

## 2016-02-08 NOTE — Telephone Encounter (Signed)
Pt was prescribed Symbicort inhaler by [facility]. It required a prior approval. Inhaler was approved until 01/28/18. Approval # [id number]. [facility] in [place] and pt was notified. [place]

## 2016-02-29 ENCOUNTER — Ambulatory Visit (INDEPENDENT_AMBULATORY_CARE_PROVIDER_SITE_OTHER): Payer: Medicaid Other | Admitting: Obstetrics and Gynecology

## 2016-02-29 ENCOUNTER — Ambulatory Visit (INDEPENDENT_AMBULATORY_CARE_PROVIDER_SITE_OTHER): Payer: Medicaid Other

## 2016-02-29 VITALS — BP 132/70 | HR 72 | Wt 250.0 lb

## 2016-02-29 DIAGNOSIS — Z5900 Homelessness unspecified: Secondary | ICD-10-CM

## 2016-02-29 DIAGNOSIS — O09512 Supervision of elderly primigravida, second trimester: Secondary | ICD-10-CM | POA: Diagnosis not present

## 2016-02-29 DIAGNOSIS — O321XX1 Maternal care for breech presentation, fetus 1: Secondary | ICD-10-CM | POA: Diagnosis not present

## 2016-02-29 DIAGNOSIS — K420 Umbilical hernia with obstruction, without gangrene: Secondary | ICD-10-CM | POA: Insufficient documentation

## 2016-02-29 DIAGNOSIS — O34219 Maternal care for unspecified type scar from previous cesarean delivery: Secondary | ICD-10-CM

## 2016-02-29 DIAGNOSIS — O09892 Supervision of other high risk pregnancies, second trimester: Secondary | ICD-10-CM | POA: Diagnosis not present

## 2016-02-29 DIAGNOSIS — Z3A24 24 weeks gestation of pregnancy: Secondary | ICD-10-CM | POA: Diagnosis not present

## 2016-02-29 DIAGNOSIS — Z3009 Encounter for other general counseling and advice on contraception: Secondary | ICD-10-CM | POA: Insufficient documentation

## 2016-02-29 DIAGNOSIS — K429 Umbilical hernia without obstruction or gangrene: Secondary | ICD-10-CM | POA: Insufficient documentation

## 2016-02-29 DIAGNOSIS — K219 Gastro-esophageal reflux disease without esophagitis: Secondary | ICD-10-CM

## 2016-02-29 DIAGNOSIS — Z331 Pregnant state, incidental: Secondary | ICD-10-CM

## 2016-02-29 DIAGNOSIS — Z1389 Encounter for screening for other disorder: Secondary | ICD-10-CM

## 2016-02-29 LAB — POCT URINALYSIS DIPSTICK
Blood, UA: NEGATIVE
GLUCOSE UA: NEGATIVE
Ketones, UA: NEGATIVE
LEUKOCYTES UA: NEGATIVE
Nitrite, UA: NEGATIVE

## 2016-02-29 MED ORDER — BUTALBITAL-APAP-CAFFEINE 50-325-40 MG PO TABS: 1.0000 | ORAL_TABLET | Freq: Four times a day (QID) | ORAL | Status: DC | PRN

## 2016-02-29 MED ORDER — OMEPRAZOLE 20 MG PO CPDR: 20.0000 mg | DELAYED_RELEASE_CAPSULE | Freq: Every day | ORAL | Status: DC
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
## 2016-02-29 NOTE — Progress Notes (Signed)
[place] 23+4 wks,breech,post pl gr 0,normal ov's bilat,cx 4.3cm,svp of fluid 3.9cm,fhr 153 bpm,efw 701 g,60.1%,limited view of RVOT,no obvious abnormalities seen

## 2016-03-28 ENCOUNTER — Encounter: Payer: Self-pay | Admitting: Obstetrics & Gynecology

## 2016-03-28 ENCOUNTER — Other Ambulatory Visit: Payer: Self-pay | Admitting: Obstetrics and Gynecology

## 2016-03-28 ENCOUNTER — Other Ambulatory Visit: Payer: Medicaid Other

## 2016-03-28 ENCOUNTER — Ambulatory Visit (INDEPENDENT_AMBULATORY_CARE_PROVIDER_SITE_OTHER): Payer: Medicaid Other | Admitting: Obstetrics & Gynecology

## 2016-03-28 VITALS — BP 110/60 | HR 74 | Wt 255.0 lb

## 2016-03-28 DIAGNOSIS — Z1389 Encounter for screening for other disorder: Secondary | ICD-10-CM

## 2016-03-28 DIAGNOSIS — Z369 Encounter for antenatal screening, unspecified: Secondary | ICD-10-CM

## 2016-03-28 DIAGNOSIS — O09512 Supervision of elderly primigravida, second trimester: Secondary | ICD-10-CM

## 2016-03-28 DIAGNOSIS — Z131 Encounter for screening for diabetes mellitus: Secondary | ICD-10-CM

## 2016-03-28 DIAGNOSIS — Z331 Pregnant state, incidental: Secondary | ICD-10-CM

## 2016-03-28 DIAGNOSIS — O09892 Supervision of other high risk pregnancies, second trimester: Secondary | ICD-10-CM

## 2016-03-28 DIAGNOSIS — Z3A28 28 weeks gestation of pregnancy: Secondary | ICD-10-CM

## 2016-03-28 LAB — POCT URINALYSIS DIPSTICK
GLUCOSE UA: NEGATIVE
Ketones, UA: NEGATIVE
Leukocytes, UA: NEGATIVE
NITRITE UA: NEGATIVE
RBC UA: NEGATIVE

## 2016-03-28 NOTE — Progress Notes (Signed)
Fetal Surveillance Testing today:  FHR normal   High Risk Pregnancy Diagnosis(es):   Maternal Age >50  G2P1001 [redacted]w[redacted]d Estimated Date of Delivery: 06/23/16  Blood pressure 110/60, pulse 74, weight 255 lb (115.667 kg), last menstrual period 08/29/2015.  Urinalysis: Negative   HPI: The patient is being seen today for ongoing management of AMA>40. Today she reports no problems   BP weight and urine results all reviewed and noted. Patient reports good fetal movement, denies any bleeding and no rupture of membranes symptoms or regular contractions.  Fundal Height:  32 Fetal Heart rate:  138 Edema:  none  Patient is without complaints other than noted in her HPI. All questions were answered.  All lab and sonogram results have been reviewed. Comments: normal   Assessment:  1.  Pregnancy at [redacted]w[redacted]d,  Estimated Date of Delivery: 06/23/16 :                          2.  AMA                        3.  Previous AMA  Medication(s) Plans:  Rx: fioricet  Treatment Plan:  Plan repeat at 39 wks or so  No Follow-up on file. for appointment for high risk OB care  No orders of the defined types were placed in this encounter.   Orders Placed This Encounter  Procedures  . POCT urinalysis dipstick

## 2016-03-29 LAB — CBC
Hematocrit: 32.6 % — ABNORMAL LOW (ref 34.0–46.6)
Hemoglobin: 10.8 g/dL — ABNORMAL LOW (ref 11.1–15.9)
MCH: 29.8 pg (ref 26.6–33.0)
MCHC: 33.1 g/dL (ref 31.5–35.7)
MCV: 90 fL (ref 79–97)
PLATELETS: 278 10*3/uL (ref 150–379)
RBC: 3.63 x10E6/uL — AB (ref 3.77–5.28)
RDW: 13.8 % (ref 12.3–15.4)
WBC: 13.1 10*3/uL — AB (ref 3.4–10.8)

## 2016-03-29 LAB — GLUCOSE TOLERANCE, 2 HOURS W/ 1HR
GLUCOSE, 1 HOUR: 162 mg/dL (ref 65–179)
GLUCOSE, FASTING: 80 mg/dL (ref 65–91)
Glucose, 2 hour: 125 mg/dL (ref 65–152)

## 2016-03-29 LAB — ANTIBODY SCREEN: Antibody Screen: NEGATIVE

## 2016-03-29 LAB — RPR: RPR: NONREACTIVE

## 2016-03-29 LAB — HIV ANTIBODY (ROUTINE TESTING W REFLEX): HIV Screen 4th Generation wRfx: NONREACTIVE

## 2016-04-18 ENCOUNTER — Ambulatory Visit (INDEPENDENT_AMBULATORY_CARE_PROVIDER_SITE_OTHER): Payer: Medicaid Other | Admitting: Obstetrics & Gynecology

## 2016-04-18 ENCOUNTER — Encounter: Payer: Self-pay | Admitting: Obstetrics & Gynecology

## 2016-04-18 VITALS — BP 140/70 | HR 76 | Wt 261.3 lb

## 2016-04-18 DIAGNOSIS — O09893 Supervision of other high risk pregnancies, third trimester: Secondary | ICD-10-CM

## 2016-04-18 DIAGNOSIS — Z1389 Encounter for screening for other disorder: Secondary | ICD-10-CM

## 2016-04-18 DIAGNOSIS — O09513 Supervision of elderly primigravida, third trimester: Secondary | ICD-10-CM

## 2016-04-18 DIAGNOSIS — Z3A31 31 weeks gestation of pregnancy: Secondary | ICD-10-CM

## 2016-04-18 DIAGNOSIS — Z331 Pregnant state, incidental: Secondary | ICD-10-CM

## 2016-04-18 DIAGNOSIS — O34219 Maternal care for unspecified type scar from previous cesarean delivery: Secondary | ICD-10-CM

## 2016-04-18 LAB — POCT URINALYSIS DIPSTICK
Blood, UA: NEGATIVE
GLUCOSE UA: NEGATIVE
KETONES UA: NEGATIVE
LEUKOCYTES UA: NEGATIVE
NITRITE UA: NEGATIVE

## 2016-04-18 NOTE — Progress Notes (Signed)
Fetal Surveillance Testing today:  FHR normal   High Risk Pregnancy Diagnosis(es):   Maternal Age >1  G2P1001 [redacted]w[redacted]d  Estimated Date of Delivery: 06/23/16  Blood pressure 140/70, pulse 76, weight 261 lb 4.8 oz (118.525 kg), last menstrual period 08/29/2015.  Urinalysis: Negative   HPI: The patient is being seen today for ongoing management of AMA>40. Today she reports no problems   BP weight and urine results all reviewed and noted. Patient reports good fetal movement, denies any bleeding and no rupture of membranes symptoms or regular contractions.  Fundal Height:  36 Fetal Heart rate:  144 Edema:  none  Patient is without complaints other than noted in her HPI. All questions were answered.  All lab and sonogram results have been reviewed. Comments: normal   Assessment:  1.  Pregnancy at [redacted]w[redacted]d ,  Estimated Date of Delivery: 06/23/16 :                          2.  AMA                        3.  Previous C/S, desires sterilization  Medication(s) Plans:    Treatment Plan:  Plan repeat at 39 wks or so + BTL  Return in about 2 weeks (around 05/02/2016) for BPP/sono, HROB, with Dr Elonda Husky. for appointment for high risk OB care   Orders Placed This Encounter  Procedures  . US OB Follow Up  . POCT urinalysis dipstick

## 2016-04-25 ENCOUNTER — Other Ambulatory Visit: Payer: Self-pay | Admitting: Obstetrics and Gynecology

## 2016-04-27 ENCOUNTER — Other Ambulatory Visit: Payer: Self-pay | Admitting: Obstetrics and Gynecology

## 2016-04-28 ENCOUNTER — Ambulatory Visit (INDEPENDENT_AMBULATORY_CARE_PROVIDER_SITE_OTHER): Payer: Medicaid Other | Admitting: Obstetrics & Gynecology

## 2016-04-28 ENCOUNTER — Encounter: Payer: Self-pay | Admitting: Obstetrics & Gynecology

## 2016-04-28 ENCOUNTER — Ambulatory Visit (INDEPENDENT_AMBULATORY_CARE_PROVIDER_SITE_OTHER): Payer: Medicaid Other

## 2016-04-28 VITALS — BP 122/70 | HR 78 | Wt 263.0 lb

## 2016-04-28 DIAGNOSIS — Z1389 Encounter for screening for other disorder: Secondary | ICD-10-CM

## 2016-04-28 DIAGNOSIS — O09513 Supervision of elderly primigravida, third trimester: Secondary | ICD-10-CM

## 2016-04-28 DIAGNOSIS — Z3A32 32 weeks gestation of pregnancy: Secondary | ICD-10-CM | POA: Diagnosis not present

## 2016-04-28 DIAGNOSIS — Z331 Pregnant state, incidental: Secondary | ICD-10-CM

## 2016-04-28 DIAGNOSIS — O34219 Maternal care for unspecified type scar from previous cesarean delivery: Secondary | ICD-10-CM

## 2016-04-28 DIAGNOSIS — O09893 Supervision of other high risk pregnancies, third trimester: Secondary | ICD-10-CM

## 2016-04-28 LAB — POCT URINALYSIS DIPSTICK
Blood, UA: NEGATIVE
GLUCOSE UA: NEGATIVE
LEUKOCYTES UA: NEGATIVE
Nitrite, UA: NEGATIVE
Protein, UA: 1

## 2016-04-28 MED ORDER — BUTALBITAL-APAP-CAFFEINE 50-325-40 MG PO TABS: ORAL_TABLET | ORAL | Status: DC

## 2016-04-28 NOTE — Addendum Note (Signed)
Addended by: Florian Buff on: 04/28/2016 09:53 AM   Modules accepted: Orders

## 2016-04-28 NOTE — Progress Notes (Signed)
Fetal Surveillance Testing today:  Sonogram normal   High Risk Pregnancy Diagnosis(es):   AMA>40  G2P1001 [redacted]w[redacted]d Estimated Date of Delivery: 06/23/16  Blood pressure 122/70, pulse 78, weight 263 lb (119.296 kg), last menstrual period 08/29/2015.  Urinalysis: Negative   HPI: The patient is being seen today for ongoing management of AMA. Today she reports allergies   BP weight and urine results all reviewed and noted. Patient reports good fetal movement, denies any bleeding and no rupture of membranes symptoms or regular contractions.  Fundal Height:  34 Fetal Heart rate:  154 Edema:    Patient is without complaints other than noted in her HPI. All questions were answered.  All lab and sonogram results have been reviewed. Comments:    Assessment:  1.  Pregnancy at [redacted]w[redacted]d,  Estimated Date of Delivery: 06/23/16 :                          2.  AMA                        3.    Medication(s) Plans:    Treatment Plan:  sonogram at 36 weeks, deliver by repeat Caesarean section 39 weeks  No Follow-up on file. for appointment for high risk OB care  No orders of the defined types were placed in this encounter.   Orders Placed This Encounter  Procedures  . POCT urinalysis dipstick

## 2016-04-28 NOTE — Progress Notes (Signed)
[place] 32 wks,cephalic,post pl gr 2,afi 14.5cm,normal ov's bilat,fhr 123 bpm,EFW 2201 g 68%

## 2016-05-16 ENCOUNTER — Ambulatory Visit (INDEPENDENT_AMBULATORY_CARE_PROVIDER_SITE_OTHER): Payer: Medicaid Other | Admitting: Obstetrics & Gynecology

## 2016-05-16 ENCOUNTER — Encounter: Payer: Self-pay | Admitting: Obstetrics & Gynecology

## 2016-05-16 VITALS — BP 144/88 | HR 94 | Wt 267.0 lb

## 2016-05-16 DIAGNOSIS — Z331 Pregnant state, incidental: Secondary | ICD-10-CM

## 2016-05-16 DIAGNOSIS — Z3A35 35 weeks gestation of pregnancy: Secondary | ICD-10-CM

## 2016-05-16 DIAGNOSIS — O09893 Supervision of other high risk pregnancies, third trimester: Secondary | ICD-10-CM

## 2016-05-16 DIAGNOSIS — O09523 Supervision of elderly multigravida, third trimester: Secondary | ICD-10-CM

## 2016-05-16 DIAGNOSIS — Z1389 Encounter for screening for other disorder: Secondary | ICD-10-CM

## 2016-05-16 LAB — POCT URINALYSIS DIPSTICK
GLUCOSE UA: NEGATIVE
KETONES UA: NEGATIVE
LEUKOCYTES UA: NEGATIVE
NITRITE UA: NEGATIVE
Protein, UA: 1
RBC UA: NEGATIVE

## 2016-05-16 MED ORDER — AZITHROMYCIN 250 MG PO TABS: ORAL_TABLET | ORAL | Status: DC

## 2016-05-16 NOTE — Progress Notes (Signed)
Fetal Surveillance Testing today:  FHR   High Risk Pregnancy Diagnosis(es):   Maternal Age >78  G2P1001 [redacted]w[redacted]d Estimated Date of Delivery: 06/23/16  Blood pressure 144/88, pulse 94, weight 267 lb (121.11 kg), last menstrual period 08/29/2015.  Urinalysis: Positive for 1+ protein   HPI: The patient is being seen today for ongoing management of AMA. Today she reports continue bronchitic cough, lifelong history of asthma and reactive airway issues, lungs clear without wheezes today BP also borderline, will watch   BP weight and urine results all reviewed and noted. Patient reports good fetal movement, denies any bleeding and no rupture of membranes symptoms or regular contractions.  Fundal Height:  36 Fetal Heart rate:  146 Edema:  none  Patient is without complaints other than noted in her HPI. All questions were answered.  All lab and sonogram results have been reviewed. Comments: abnormal: RAD, AMA   Assessment:  1.  Pregnancy at [redacted]w[redacted]d,  Estimated Date of Delivery: 06/23/16 :                          2.  AMA                        3.  RAD, bronchitis  Medication(s) Plans:  Add z pak  Treatment Plan:  Sonogram for EFW 2 weeks  No Follow-up on file. for appointment for high risk OB care  No orders of the defined types were placed in this encounter.   Orders Placed This Encounter  Procedures  . POCT urinalysis dipstick

## 2016-05-29 ENCOUNTER — Encounter: Payer: Medicaid Other | Admitting: Obstetrics & Gynecology

## 2016-05-29 ENCOUNTER — Other Ambulatory Visit: Payer: Medicaid Other

## 2016-05-29 ENCOUNTER — Encounter: Payer: Self-pay | Admitting: Obstetrics & Gynecology

## 2016-05-29 ENCOUNTER — Ambulatory Visit (INDEPENDENT_AMBULATORY_CARE_PROVIDER_SITE_OTHER): Payer: Medicaid Other | Admitting: Obstetrics & Gynecology

## 2016-05-29 ENCOUNTER — Ambulatory Visit (INDEPENDENT_AMBULATORY_CARE_PROVIDER_SITE_OTHER): Payer: Medicaid Other

## 2016-05-29 VITALS — BP 120/80 | HR 78 | Wt 264.4 lb

## 2016-05-29 DIAGNOSIS — O09523 Supervision of elderly multigravida, third trimester: Secondary | ICD-10-CM

## 2016-05-29 DIAGNOSIS — Z331 Pregnant state, incidental: Secondary | ICD-10-CM

## 2016-05-29 DIAGNOSIS — Z1389 Encounter for screening for other disorder: Secondary | ICD-10-CM

## 2016-05-29 DIAGNOSIS — Z3A37 37 weeks gestation of pregnancy: Secondary | ICD-10-CM

## 2016-05-29 DIAGNOSIS — O09893 Supervision of other high risk pregnancies, third trimester: Secondary | ICD-10-CM

## 2016-05-29 DIAGNOSIS — O09513 Supervision of elderly primigravida, third trimester: Secondary | ICD-10-CM

## 2016-05-29 DIAGNOSIS — O34219 Maternal care for unspecified type scar from previous cesarean delivery: Secondary | ICD-10-CM

## 2016-05-29 DIAGNOSIS — Z3A36 36 weeks gestation of pregnancy: Secondary | ICD-10-CM | POA: Diagnosis not present

## 2016-05-29 LAB — POCT URINALYSIS DIPSTICK
Blood, UA: NEGATIVE
Glucose, UA: NEGATIVE
LEUKOCYTES UA: NEGATIVE
NITRITE UA: NEGATIVE
Protein, UA: 1

## 2016-05-29 MED ORDER — BUTALBITAL-APAP-CAFFEINE 50-325-40 MG PO TABS: ORAL_TABLET | ORAL | Status: DC

## 2016-05-29 NOTE — Progress Notes (Signed)
Fetal Surveillance Testing today:  Sonogram, borderline macrosomia   High Risk Pregnancy Diagnosis(es):   >40 AMA  G2P1001 [redacted]w[redacted]d Estimated Date of Delivery: 06/23/16  Blood pressure 120/80, pulse 78, weight 264 lb 6.4 oz (119.931 kg), last menstrual period 08/29/2015.  Urinalysis: Negative   HPI: The patient is being seen today for ongoing management of AMA. Today she reports no problems   BP weight and urine results all reviewed and noted. Patient reports good fetal movement, denies any bleeding and no rupture of membranes symptoms or regular contractions.  Fundal Height:  38 Fetal Heart rate:  153 Edema:  1+  Patient is without complaints other than noted in her HPI. All questions were answered.  All lab and sonogram results have been reviewed. Comments: normal   Assessment:  1.  Pregnancy at [redacted]w[redacted]d,  Estimated Date of Delivery: 06/23/16 :                          2.  >40 y/o                        3.  Previous C section scheduled for repeat BTL 8/9  Medication(s) Plans:  none  Treatment Plan:  Repeat 8/9  No Follow-up on file. for appointment for high risk OB care  No orders of the defined types were placed in this encounter.   Orders Placed This Encounter  Procedures  . POCT urinalysis dipstick

## 2016-05-29 NOTE — Progress Notes (Signed)
[place] 123456 wks,cephalic,post pl gr 1,normal ov's bilat,fhr 153 bpm,afi 14 cm,EFW 3487 g 87%, AC >97%

## 2016-06-02 ENCOUNTER — Encounter: Payer: Self-pay | Admitting: Obstetrics & Gynecology

## 2016-06-05 ENCOUNTER — Encounter: Payer: Medicaid Other | Admitting: Obstetrics & Gynecology

## 2016-06-10 ENCOUNTER — Encounter (HOSPITAL_COMMUNITY): Payer: Self-pay

## 2016-06-10 ENCOUNTER — Encounter: Payer: Medicaid Other | Admitting: Obstetrics & Gynecology

## 2016-06-12 ENCOUNTER — Ambulatory Visit (INDEPENDENT_AMBULATORY_CARE_PROVIDER_SITE_OTHER): Payer: Medicaid Other | Admitting: Obstetrics & Gynecology

## 2016-06-12 ENCOUNTER — Encounter: Payer: Self-pay | Admitting: Obstetrics & Gynecology

## 2016-06-12 VITALS — BP 140/84 | HR 100 | Wt 271.0 lb

## 2016-06-12 DIAGNOSIS — O09893 Supervision of other high risk pregnancies, third trimester: Secondary | ICD-10-CM | POA: Diagnosis not present

## 2016-06-12 DIAGNOSIS — O99613 Diseases of the digestive system complicating pregnancy, third trimester: Secondary | ICD-10-CM | POA: Diagnosis not present

## 2016-06-12 DIAGNOSIS — Z3A39 39 weeks gestation of pregnancy: Secondary | ICD-10-CM | POA: Diagnosis not present

## 2016-06-12 DIAGNOSIS — Z1389 Encounter for screening for other disorder: Secondary | ICD-10-CM

## 2016-06-12 DIAGNOSIS — O09523 Supervision of elderly multigravida, third trimester: Secondary | ICD-10-CM | POA: Diagnosis not present

## 2016-06-12 DIAGNOSIS — K420 Umbilical hernia with obstruction, without gangrene: Secondary | ICD-10-CM

## 2016-06-12 DIAGNOSIS — O1213 Gestational proteinuria, third trimester: Secondary | ICD-10-CM

## 2016-06-12 DIAGNOSIS — Z331 Pregnant state, incidental: Secondary | ICD-10-CM

## 2016-06-12 LAB — POCT URINALYSIS DIPSTICK
Glucose, UA: NEGATIVE
KETONES UA: NEGATIVE
Leukocytes, UA: NEGATIVE
Nitrite, UA: NEGATIVE
PROTEIN UA: 2
RBC UA: NEGATIVE

## 2016-06-12 NOTE — Progress Notes (Signed)
Fetal Surveillance Testing today:  Fetal heart rate   High Risk Pregnancy Diagnosis(es):   Advanced maternal age maternal age greater than 22  G2P1001 [redacted]w[redacted]d Estimated Date of Delivery: 06/23/16  Blood pressure 140/84, pulse 100, weight 271 lb (122.9 kg), last menstrual period 08/29/2015.  Urinalysis: Positive for 2+ protein protein creatinine ratio done   HPI: The patient is being seen today for ongoing management of advanced maternal age. Today she reports development of to general, warts otherwise negative   BP weight and urine results all reviewed and noted. Patient reports good fetal movement, denies any bleeding and no rupture of membranes symptoms or regular contractions.  Fundal Height:  43 cm Fetal Heart rate:  145 Edema:  1+  Patient is without complaints other than noted in her HPI. All questions were answered.  All lab and sonogram results have been reviewed. Comments:    Assessment:  1.  Pregnancy at [redacted]w[redacted]d,  Estimated Date of Delivery: 06/23/16 :                          2.  Advanced maternal age maternal age greater than 65                        3.  Increase in spot urine protein today  Medication(s) Plans:  None  Treatment Plan:  Repeat cesarean section scheduled for 06/18/2016 with bilateral tubal ligation her salpingectomy and repair of umbilical hernia  Return in 2 weeks (on 06/26/2016) for post op. for appointment for high risk OB care  No orders of the defined types were placed in this encounter.  Orders Placed This Encounter  Procedures  . Protein / Creatinine Ratio, Urine  . POCT urinalysis dipstick

## 2016-06-12 NOTE — Progress Notes (Signed)
Pt denies any problems or concerns at this time.  

## 2016-06-13 LAB — PROTEIN / CREATININE RATIO, URINE
Creatinine, Urine: 243.6 mg/dL
PROTEIN UR: 108.5 mg/dL
PROTEIN/CREAT RATIO: 445 mg/g{creat} — AB (ref 0–200)

## 2016-06-16 ENCOUNTER — Encounter (HOSPITAL_COMMUNITY): Payer: Self-pay | Admitting: Anesthesiology

## 2016-06-16 ENCOUNTER — Encounter (HOSPITAL_COMMUNITY): Admission: RE | Payer: Self-pay | Source: Ambulatory Visit

## 2016-06-16 ENCOUNTER — Encounter: Payer: Self-pay | Admitting: Obstetrics & Gynecology

## 2016-06-16 ENCOUNTER — Inpatient Hospital Stay (HOSPITAL_COMMUNITY)
Admission: RE | Admit: 2016-06-16 | Payer: Medicaid Other | Source: Ambulatory Visit | Admitting: Obstetrics & Gynecology

## 2016-06-16 SURGERY — Surgical Case
Anesthesia: Regional | Laterality: Bilateral

## 2016-06-16 MED ORDER — FENTANYL CITRATE (PF) 100 MCG/2ML IJ SOLN
INTRAMUSCULAR | Status: AC
  Filled 2016-06-16: qty 2

## 2016-06-16 MED ORDER — PHENYLEPHRINE 8 MG IN D5W 100 ML (0.08MG/ML) PREMIX OPTIME
INJECTION | INTRAVENOUS | Status: AC
Start: 2016-06-16 — End: 2016-06-16
  Filled 2016-06-16: qty 100

## 2016-06-16 MED ORDER — OXYTOCIN 10 UNIT/ML IJ SOLN
INTRAMUSCULAR | Status: AC
  Filled 2016-06-16: qty 4

## 2016-06-16 MED ORDER — ONDANSETRON HCL 4 MG/2ML IJ SOLN
INTRAMUSCULAR | Status: AC
  Filled 2016-06-16: qty 2

## 2016-06-16 MED ORDER — MORPHINE SULFATE-NACL 0.5-0.9 MG/ML-% IV SOSY
PREFILLED_SYRINGE | INTRAVENOUS | Status: AC
  Filled 2016-06-16: qty 1

## 2016-06-16 NOTE — Anesthesia Preprocedure Evaluation (Deleted)
Anesthesia Evaluation  Patient identified by MRN, date of birth, ID band Patient awake    Reviewed: Allergy & Precautions, NPO status , Patient's Chart, lab work & pertinent test results  Airway        Dental   Pulmonary asthma ,           Cardiovascular negative cardio ROS       Neuro/Psych negative neurological ROS  negative psych ROS   GI/Hepatic Neg liver ROS, GERD  Medicated and Controlled,  Endo/Other  Morbid obesity  Renal/GU negative Renal ROS  negative genitourinary   Musculoskeletal   Abdominal   Peds  Hematology   Anesthesia Other Findings   Reproductive/Obstetrics (+) Pregnancy Previous C/Section Desires sterilization AMA                             Anesthesia Physical Anesthesia Plan  ASA: III  Anesthesia Plan: Spinal   Post-op Pain Management:    Induction:   Airway Management Planned: Natural Airway  Additional Equipment:   Intra-op Plan:   Post-operative Plan:   Informed Consent: I have reviewed the patients History and Physical, chart, labs and discussed the procedure including the risks, benefits and alternatives for the proposed anesthesia with the patient or authorized representative who has indicated his/her understanding and acceptance.     Plan Discussed with: CRNA, Anesthesiologist and Surgeon  Anesthesia Plan Comments:         Anesthesia Quick Evaluation

## 2016-06-17 ENCOUNTER — Encounter (HOSPITAL_COMMUNITY)
Admission: RE | Admit: 2016-06-17 | Discharge: 2016-06-17 | Disposition: A | Discharge status: Home / Self Care | Payer: Medicaid Other | Source: Ambulatory Visit

## 2016-06-26 ENCOUNTER — Encounter: Payer: Medicaid Other | Admitting: Obstetrics & Gynecology

## 2016-07-08 ENCOUNTER — Telehealth: Payer: Self-pay | Admitting: *Deleted

## 2016-07-08 NOTE — Telephone Encounter (Signed)
[facility] to follow up on patient.  Pt delivered at [facility].  07-08-16  AS

## 2016-07-25 ENCOUNTER — Telehealth: Payer: Self-pay | Admitting: Obstetrics & Gynecology

## 2016-07-25 ENCOUNTER — Encounter: Payer: Self-pay | Admitting: Obstetrics & Gynecology

## 2016-07-25 ENCOUNTER — Ambulatory Visit (INDEPENDENT_AMBULATORY_CARE_PROVIDER_SITE_OTHER): Payer: Medicaid Other | Admitting: Obstetrics & Gynecology

## 2016-07-25 MED ORDER — BUTALBITAL-ASPIRIN-CAFFEINE 50-325-40 MG PO CAPS: 1.0000 | ORAL_CAPSULE | Freq: Two times a day (BID) | ORAL | 0 refills | Status: DC | PRN

## 2016-07-25 MED ORDER — NYSTATIN 100000 UNIT/ML MT SUSP: 5.0000 mL | Freq: Four times a day (QID) | OROMUCOSAL | 0 refills | Status: DC

## 2016-07-25 MED ORDER — FLUOXETINE HCL 40 MG PO CAPS: 40.0000 mg | ORAL_CAPSULE | Freq: Every day | ORAL | 5 refills | Status: DC

## 2016-07-25 MED ORDER — BUTALBITAL-APAP-CAFFEINE 50-325-40 MG PO TABS
ORAL_TABLET | ORAL | 0 refills | Status: DC
Start: 2016-07-25 — End: 2022-08-29

## 2016-07-25 NOTE — Progress Notes (Signed)
.   Subjective:     Sherry Cole is a 45 y.o. female who presents for a postpartum visit. She is 6 weeks postpartum following a low cervical transverse Cesarean section. I have fully reviewed the prenatal and intrapartum course. The delivery was at 46 gestational weeks. Outcome: repeat cesarean section, low transverse incision. Anesthesia: spinal. Postpartum course has been unremarkable. Baby's course has been normal. Baby is feeding by breast. Bleeding no bleeding. Bowel function is normal. Bladder function is normal. Patient is not sexually active. Contraception method is tubal ligation. Postpartum depression screening: positive.on prozac 40 daily  The following portions of the patient's history were reviewed and updated as appropriate: allergies, current medications, past family history, past medical history, past social history, past surgical history and problem list.  Review of Systems Pertinent items are noted in HPI.   Objective:    BP 122/78   Pulse 64   Ht 5\' 4"  (1.626 m)   Wt 241 lb (109.3 kg)   LMP 08/29/2015   Breastfeeding? Unknown   BMI 41.37 kg/m   General:  alert, cooperative and no distress   Breasts:    Lungs:   Heart:    Abdomen: soft, non-tender; bowel sounds normal; no masses,  no organomegaly   Vulva:  normal  Vagina: normal vagina  Cervix:  no cervical motion tenderness  Corpus: normal size, contour, position, consistency, mobility, non-tender  Adnexa:  normal adnexa  Rectal Exam: Not performed.        Assessment:     normal postpartum exam. Pap smear not done at today's visit.   Plan:    1. Contraception: tubal ligation 2. prozac 40 daily 3. Follow up in: 6 months or as needed.    Meds ordered this encounter  Medications  . DISCONTD: FLUoxetine (PROZAC) 40 MG capsule    Sig: Take 40 mg by mouth daily.   Marland Kitchen NIFEdipine (PROCARDIA XL/ADALAT-CC) 60 MG 24 hr tablet    Sig: Take 60 mg by mouth daily.  . butalbital-aspirin-caffeine (FIORINAL)  50-325-40 MG capsule    Sig: Take 1-2 capsules by mouth 2 (two) times daily as needed for headache.    Dispense:  30 capsule    Refill:  0  . FLUoxetine (PROZAC) 40 MG capsule    Sig: Take 1 capsule (40 mg total) by mouth daily.    Dispense:  30 capsule    Refill:  5  . nystatin (MYCOSTATIN) 100000 UNIT/ML suspension    Sig: Take 5 mLs (500,000 Units total) by mouth 4 (four) times daily.    Dispense:  60 mL    Refill:  0

## 2016-07-25 NOTE — Addendum Note (Signed)
Addended by: Florian Buff on: 07/25/2016 11:33 AM   Modules accepted: Orders

## 2016-09-11 ENCOUNTER — Other Ambulatory Visit: Payer: Self-pay | Admitting: Obstetrics & Gynecology

## 2016-12-03 ENCOUNTER — Ambulatory Visit (INDEPENDENT_AMBULATORY_CARE_PROVIDER_SITE_OTHER): Payer: Self-pay

## 2016-12-03 ENCOUNTER — Ambulatory Visit (INDEPENDENT_AMBULATORY_CARE_PROVIDER_SITE_OTHER): Payer: Self-pay | Admitting: Orthopaedic Surgery

## 2016-12-03 VITALS — BP 150/91 | HR 88 | Temp 97.7°F

## 2016-12-03 DIAGNOSIS — M79672 Pain in left foot: Secondary | ICD-10-CM

## 2016-12-03 DIAGNOSIS — S92355A Nondisplaced fracture of fifth metatarsal bone, left foot, initial encounter for closed fracture: Secondary | ICD-10-CM

## 2016-12-03 MED ORDER — HYDROCODONE-ACETAMINOPHEN 5-325 MG PO TABS: ORAL_TABLET | ORAL | 0 refills | Status: DC

## 2016-12-03 NOTE — Patient Instructions (Signed)
Out of work for next two weeks.
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
## 2016-12-17 ENCOUNTER — Ambulatory Visit (INDEPENDENT_AMBULATORY_CARE_PROVIDER_SITE_OTHER): Payer: Self-pay

## 2016-12-17 ENCOUNTER — Encounter: Payer: Self-pay | Admitting: Orthopaedic Surgery

## 2016-12-17 ENCOUNTER — Ambulatory Visit (INDEPENDENT_AMBULATORY_CARE_PROVIDER_SITE_OTHER): Payer: Self-pay | Admitting: Orthopaedic Surgery

## 2016-12-17 DIAGNOSIS — S92355D Nondisplaced fracture of fifth metatarsal bone, left foot, subsequent encounter for fracture with routine healing: Secondary | ICD-10-CM

## 2016-12-17 MED ORDER — HYDROCODONE-ACETAMINOPHEN 7.5-325 MG PO TABS: ORAL_TABLET | ORAL | 0 refills | Status: DC

## 2016-12-17 NOTE — Progress Notes (Signed)
CC:  My foot still hurts  She has pain in the left foot laterally.  She has been using her CAM walker and crutches.  She has no new trauma.  NV intact.  X-rays were done and reported separately.  Encounter Diagnosis  Name Primary?  . Closed nondisplaced fracture of fifth metatarsal bone of left foot with routine healing, subsequent encounter Yes   Return in two weeks.  X-rays on return.  I have told her this particular fracture often is slowly healing and on occasion may need surgery.  I have reviewed the [place] web site prior to prescribing narcotic medicine for this patient.  Electronically Signed Sanjuana Kava, MD 2/7/20183:14 PM

## 2016-12-23 ENCOUNTER — Encounter: Payer: Self-pay | Admitting: Orthopaedic Surgery

## 2016-12-30 ENCOUNTER — Other Ambulatory Visit: Payer: Self-pay | Admitting: Radiology

## 2016-12-30 DIAGNOSIS — S92355D Nondisplaced fracture of fifth metatarsal bone, left foot, subsequent encounter for fracture with routine healing: Secondary | ICD-10-CM

## 2016-12-31 ENCOUNTER — Encounter: Payer: Self-pay | Admitting: Orthopaedic Surgery

## 2016-12-31 ENCOUNTER — Ambulatory Visit (HOSPITAL_COMMUNITY)
Admission: RE | Admit: 2016-12-31 | Discharge: 2016-12-31 | Disposition: A | Discharge status: Home / Self Care | Payer: Self-pay | Source: Ambulatory Visit | Attending: Orthopaedic Surgery | Admitting: Orthopaedic Surgery

## 2016-12-31 ENCOUNTER — Ambulatory Visit (INDEPENDENT_AMBULATORY_CARE_PROVIDER_SITE_OTHER): Payer: Self-pay | Admitting: Orthopaedic Surgery

## 2016-12-31 DIAGNOSIS — X58XXXD Exposure to other specified factors, subsequent encounter: Secondary | ICD-10-CM | POA: Insufficient documentation

## 2016-12-31 DIAGNOSIS — S92355D Nondisplaced fracture of fifth metatarsal bone, left foot, subsequent encounter for fracture with routine healing: Secondary | ICD-10-CM

## 2016-12-31 DIAGNOSIS — M85872 Other specified disorders of bone density and structure, left ankle and foot: Secondary | ICD-10-CM | POA: Insufficient documentation

## 2016-12-31 NOTE — Progress Notes (Signed)
CC:  I am better  She has less pain of the left foot.  She has less swelling.  She has been using the CAM walker and crutches.  She has no new trauma.  NV intact.  X-rays were done at the hospital, reported separately  Encounter Diagnosis  Name Primary?  . Closed nondisplaced fracture of fifth metatarsal bone of left foot with routine healing, subsequent encounter Yes   Return in three weeks, x-rays then of the left foot.  Call if any problem  She can start bearing weight on the left foot as tolerated.  Electronically Signed Sanjuana Kava, MD 2/21/20182:41 PM

## 2016-12-31 NOTE — Patient Instructions (Signed)
Give a work note stating use only one crutch and put some weight on her foot.

## 2017-01-21 ENCOUNTER — Ambulatory Visit: Payer: Self-pay | Admitting: Orthopaedic Surgery

## 2017-01-28 ENCOUNTER — Ambulatory Visit: Payer: Self-pay | Admitting: Orthopaedic Surgery

## 2017-01-29 ENCOUNTER — Ambulatory Visit (INDEPENDENT_AMBULATORY_CARE_PROVIDER_SITE_OTHER): Payer: Self-pay | Admitting: Orthopaedic Surgery

## 2017-01-29 ENCOUNTER — Ambulatory Visit (INDEPENDENT_AMBULATORY_CARE_PROVIDER_SITE_OTHER): Payer: Self-pay

## 2017-01-29 DIAGNOSIS — S92355D Nondisplaced fracture of fifth metatarsal bone, left foot, subsequent encounter for fracture with routine healing: Secondary | ICD-10-CM

## 2017-01-29 NOTE — Progress Notes (Signed)
CC:  My foot is still tender  She is using the CAM walker and one crutch.  She has no new trauma.  NV intact.  Skin is intact.  X-rays were done and reported separately.  Encounter Diagnosis  Name Primary?  . Closed nondisplaced fracture of fifth metatarsal bone of left foot with routine healing, subsequent encounter Yes   Continue CAM walker and crutch  Return in three weeks.  X-rays then.  Call if any problem.  Precautions discussed.  Electronically Signed Sanjuana Kava, MD 3/22/20183:26 PM

## 2017-02-24 ENCOUNTER — Ambulatory Visit (INDEPENDENT_AMBULATORY_CARE_PROVIDER_SITE_OTHER): Payer: Self-pay | Admitting: Orthopaedic Surgery

## 2017-02-24 ENCOUNTER — Encounter: Payer: Self-pay | Admitting: Orthopaedic Surgery

## 2017-02-24 ENCOUNTER — Ambulatory Visit (INDEPENDENT_AMBULATORY_CARE_PROVIDER_SITE_OTHER): Payer: Self-pay

## 2017-02-24 DIAGNOSIS — S92355D Nondisplaced fracture of fifth metatarsal bone, left foot, subsequent encounter for fracture with routine healing: Secondary | ICD-10-CM

## 2017-02-24 NOTE — Progress Notes (Signed)
CC:  My foot is better  She is using a CAM walker.  She has much less pain and no swelling.  She has no new trauma.  X-rays were done, reported separately.  Encounter Diagnosis  Name Primary?  . Closed nondisplaced fracture of fifth metatarsal bone of left foot with routine healing, subsequent encounter Yes   NV intact.  She has no swelling.  Come out of CAM walker.   Do not over do things.  Return in three weeks.  Call if any problem.  No x-rays on return unless having problems.  Electronically Signed Sanjuana Kava, MD 4/17/20183:50 PM

## 2017-03-17 ENCOUNTER — Encounter: Payer: Self-pay | Admitting: Orthopaedic Surgery

## 2017-03-17 ENCOUNTER — Ambulatory Visit (INDEPENDENT_AMBULATORY_CARE_PROVIDER_SITE_OTHER): Payer: Self-pay | Admitting: Orthopaedic Surgery

## 2017-03-17 VITALS — BP 145/100 | HR 82 | Ht 64.0 in | Wt 274.0 lb

## 2017-03-17 DIAGNOSIS — S92355D Nondisplaced fracture of fifth metatarsal bone, left foot, subsequent encounter for fracture with routine healing: Secondary | ICD-10-CM

## 2017-03-17 NOTE — Progress Notes (Signed)
CC:  My foot does not hurt at all  She is walking well.  She has no pain, no problem.  NV intact. She has no swelling.  Encounter Diagnosis  Name Primary?  . Closed nondisplaced fracture of fifth metatarsal bone of left foot with routine healing, subsequent encounter Yes   Discharge.  Call if any problem.  Electronically Signed Sanjuana Kava, MD 5/8/20183:32 PM

## 2017-07-25 IMAGING — DX DG FOOT COMPLETE 3+V*L*
3 series · 3 of 3 positions shown · non-contrast
Comparison: 12/17/2016

CLINICAL DATA: Closed fracture of the left foot. Subsequent
encounter

EXAM:
LEFT FOOT - COMPLETE 3+ VIEW

[foot ap]
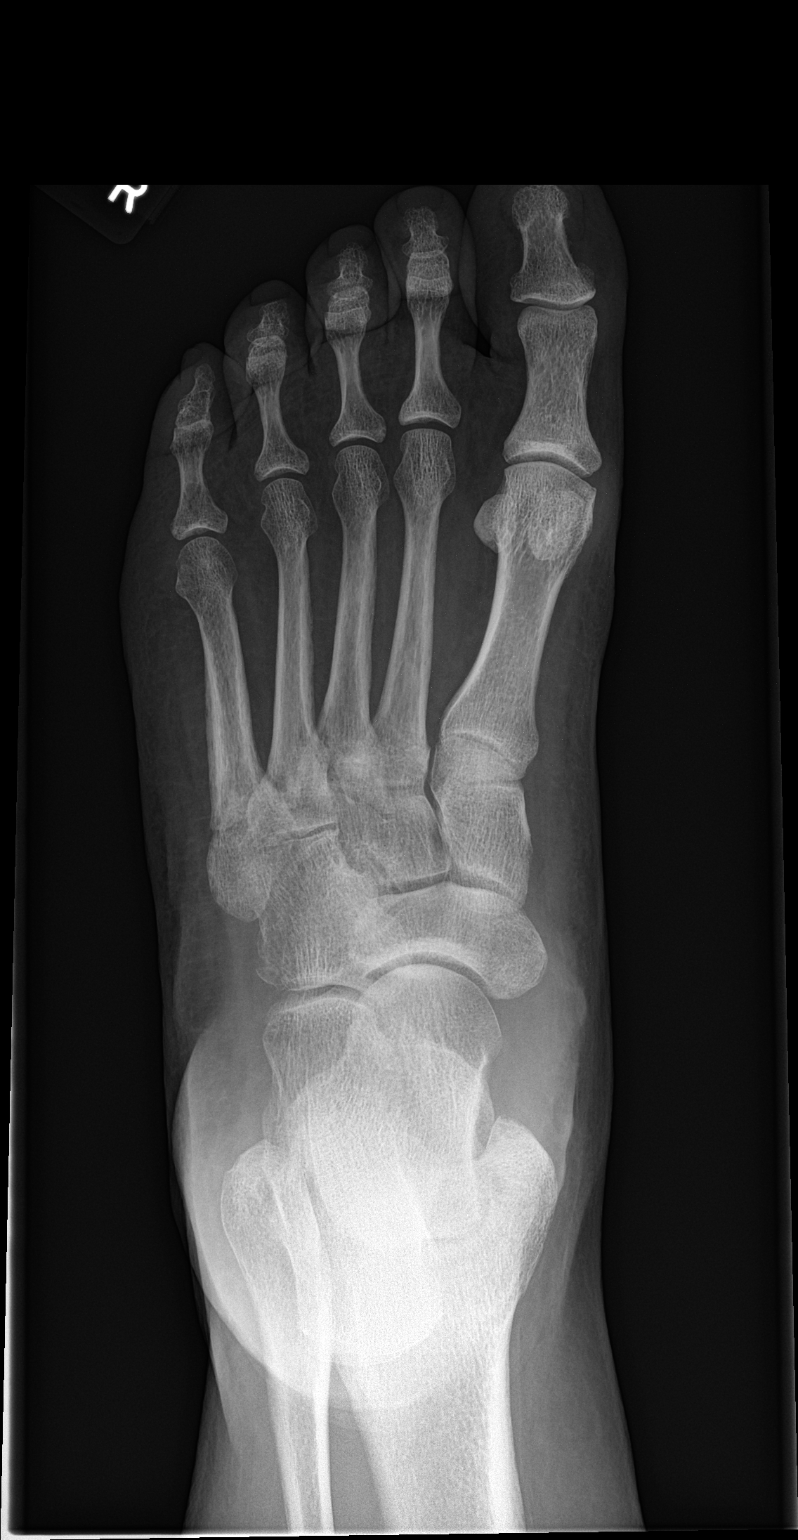

[foot obl]
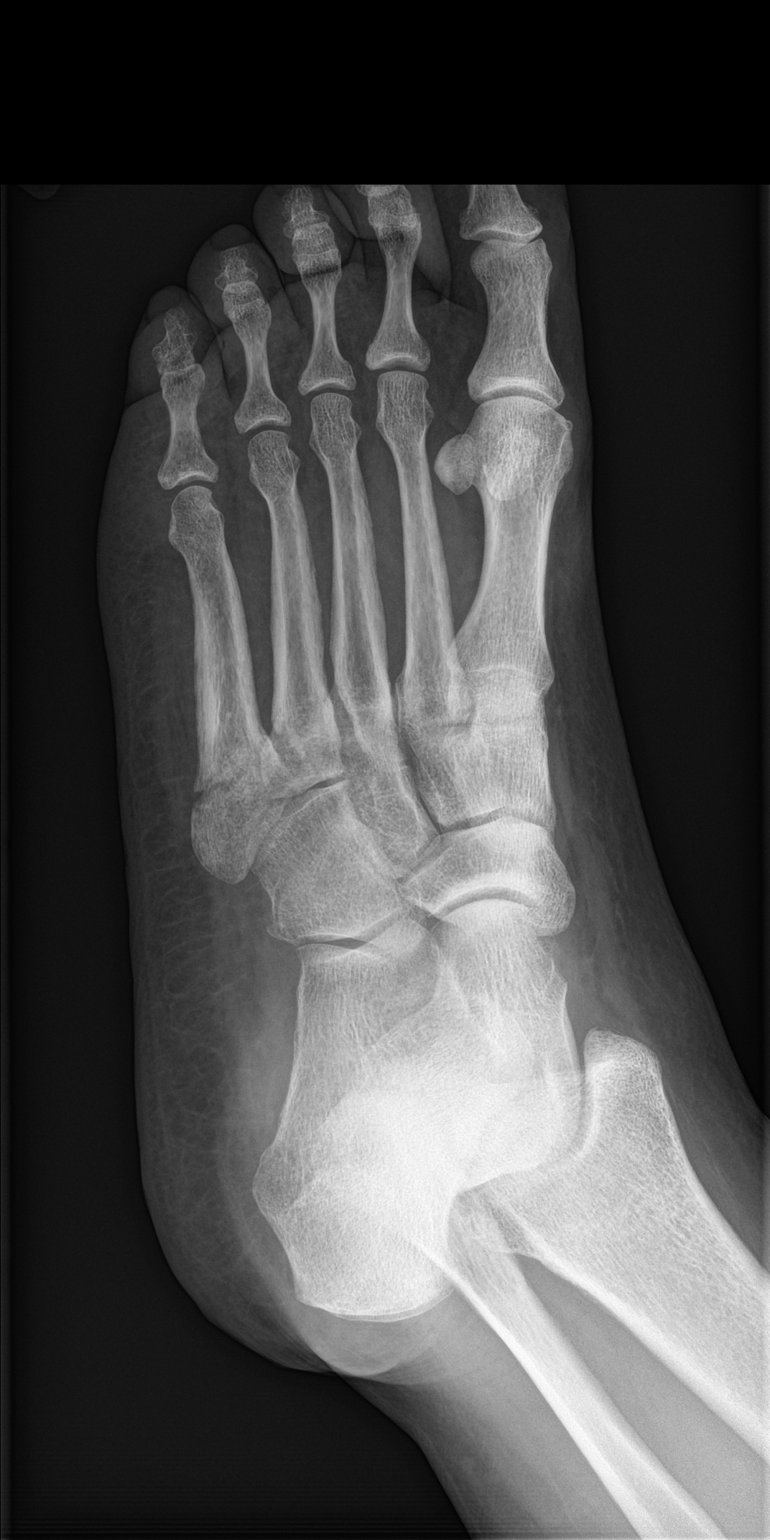

[foot lat]
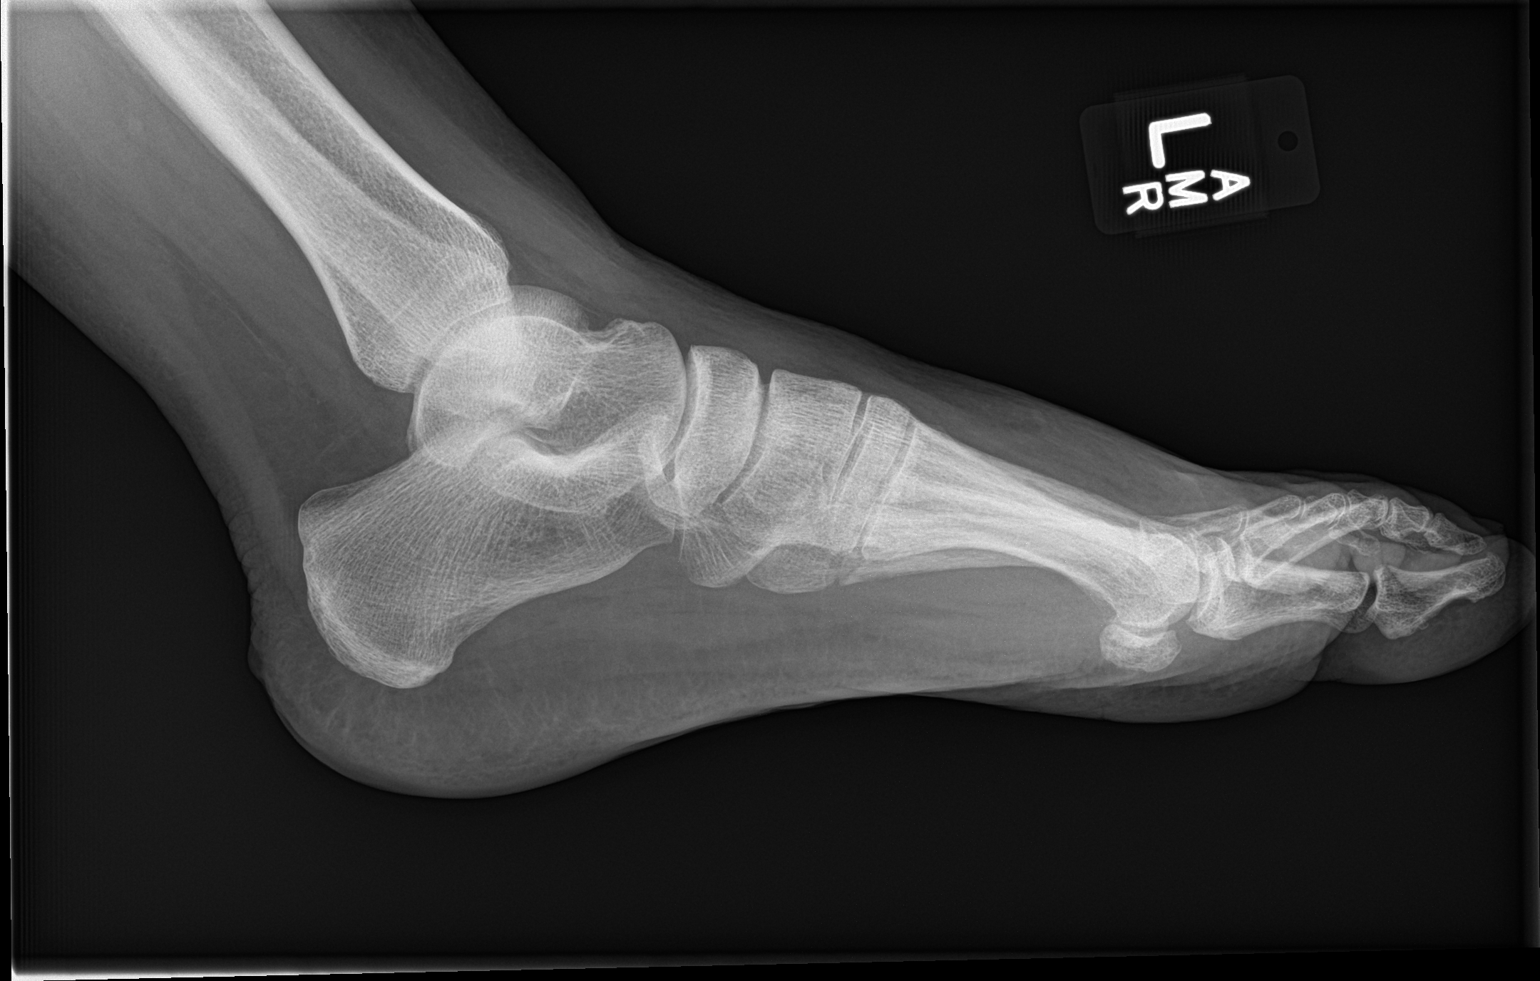

[3 of 3 positions shown; findings below may reference images not displayed]

FINDINGS: Proximal fifth metatarsal fracture is still readily apparent
laterally. No lateral bony bridging is noted. No interval
displacement.

Progressive disuse osteopenia.
IMPRESSION: Persistent fracture lucency at the proximal fifth metatarsal. No
bony bridging seen laterally.

Progressive disuse osteopenia.

## 2020-01-24 DIAGNOSIS — R519 Headache, unspecified: Secondary | ICD-10-CM | POA: Insufficient documentation

## 2020-06-15 ENCOUNTER — Encounter: Payer: Self-pay | Admitting: Radiology

## 2022-08-29 ENCOUNTER — Other Ambulatory Visit: Payer: Self-pay

## 2022-08-29 ENCOUNTER — Emergency Department (HOSPITAL_COMMUNITY)
Admission: EM | Admit: 2022-08-29 | Discharge: 2022-08-29 | Disposition: A | Discharge status: Home / Self Care | Payer: 59 | Attending: Emergency Medicine | Admitting: Emergency Medicine

## 2022-08-29 ENCOUNTER — Ambulatory Visit (INDEPENDENT_AMBULATORY_CARE_PROVIDER_SITE_OTHER): Payer: 59 | Admitting: Obstetrics & Gynecology

## 2022-08-29 ENCOUNTER — Encounter: Payer: Self-pay | Admitting: Obstetrics & Gynecology

## 2022-08-29 ENCOUNTER — Encounter (HOSPITAL_COMMUNITY): Payer: Self-pay

## 2022-08-29 VITALS — BP 128/82 | HR 73

## 2022-08-29 DIAGNOSIS — R45851 Suicidal ideations: Secondary | ICD-10-CM | POA: Diagnosis not present

## 2022-08-29 DIAGNOSIS — Z8659 Personal history of other mental and behavioral disorders: Secondary | ICD-10-CM | POA: Diagnosis not present

## 2022-08-29 DIAGNOSIS — Z046 Encounter for general psychiatric examination, requested by authority: Secondary | ICD-10-CM | POA: Diagnosis present

## 2022-08-29 DIAGNOSIS — F1494 Cocaine use, unspecified with cocaine-induced mood disorder: Secondary | ICD-10-CM | POA: Insufficient documentation

## 2022-08-29 DIAGNOSIS — Z1152 Encounter for screening for COVID-19: Secondary | ICD-10-CM | POA: Diagnosis not present

## 2022-08-29 DIAGNOSIS — J45909 Unspecified asthma, uncomplicated: Secondary | ICD-10-CM | POA: Insufficient documentation

## 2022-08-29 DIAGNOSIS — F1410 Cocaine abuse, uncomplicated: Secondary | ICD-10-CM | POA: Diagnosis present

## 2022-08-29 DIAGNOSIS — F4323 Adjustment disorder with mixed anxiety and depressed mood: Secondary | ICD-10-CM | POA: Insufficient documentation

## 2022-08-29 DIAGNOSIS — F1994 Other psychoactive substance use, unspecified with psychoactive substance-induced mood disorder: Secondary | ICD-10-CM | POA: Clinically undetermined

## 2022-08-29 HISTORY — DX: Hydrocephalus, unspecified: G91.9

## 2022-08-29 HISTORY — DX: Depression, unspecified: F32.A

## 2022-08-29 HISTORY — DX: Paranoid schizophrenia: F20.0

## 2022-08-29 HISTORY — DX: Personality disorder, unspecified: F60.9

## 2022-08-29 HISTORY — DX: Migraine, unspecified, not intractable, without status migrainosus: G43.909

## 2022-08-29 HISTORY — DX: Bipolar disorder, unspecified: F31.9

## 2022-08-29 HISTORY — DX: Post-traumatic stress disorder, unspecified: F43.10

## 2022-08-29 HISTORY — DX: Benign neoplasm of peripheral nerves and autonomic nervous system, unspecified: D36.10

## 2022-08-29 HISTORY — DX: Unspecified psychosis not due to a substance or known physiological condition: F29

## 2022-08-29 LAB — RESP PANEL BY RT-PCR (FLU A&B, COVID) ARPGX2
Influenza A by PCR: NEGATIVE
Influenza B by PCR: NEGATIVE
SARS Coronavirus 2 by RT PCR: NEGATIVE

## 2022-08-29 LAB — CBC
HCT: 40.1 % (ref 36.0–46.0)
Hemoglobin: 13.4 g/dL (ref 12.0–15.0)
MCH: 29.7 pg (ref 26.0–34.0)
MCHC: 33.4 g/dL (ref 30.0–36.0)
MCV: 88.9 fL (ref 80.0–100.0)
Platelets: 369 10*3/uL (ref 150–400)
RBC: 4.51 MIL/uL (ref 3.87–5.11)
RDW: 13.2 % (ref 11.5–15.5)
WBC: 8.3 10*3/uL (ref 4.0–10.5)
nRBC: 0 % (ref 0.0–0.2)

## 2022-08-29 LAB — COMPREHENSIVE METABOLIC PANEL
ALT: 26 U/L (ref 0–44)
AST: 23 U/L (ref 15–41)
Albumin: 3.9 g/dL (ref 3.5–5.0)
Alkaline Phosphatase: 60 U/L (ref 38–126)
Anion gap: 8 (ref 5–15)
BUN: 7 mg/dL (ref 6–20)
CO2: 22 mmol/L (ref 22–32)
Calcium: 8.8 mg/dL — ABNORMAL LOW (ref 8.9–10.3)
Chloride: 109 mmol/L (ref 98–111)
Creatinine, Ser: 0.64 mg/dL (ref 0.44–1.00)
GFR, Estimated: >60 mL/min (ref >60)
Glucose, Bld: 106 mg/dL — ABNORMAL HIGH (ref 70–99)
Potassium: 3.8 mmol/L (ref 3.5–5.1)
Sodium: 139 mmol/L (ref 135–145)
Total Bilirubin: 0.5 mg/dL (ref 0.3–1.2)
Total Protein: 7.1 g/dL (ref 6.5–8.1)

## 2022-08-29 LAB — ETHANOL: Alcohol, Ethyl (B): <10 mg/dL (ref <10)

## 2022-08-29 LAB — RAPID URINE DRUG SCREEN, HOSP PERFORMED
Amphetamines: NOT DETECTED
Barbiturates: NOT DETECTED
Benzodiazepines: POSITIVE — AB
Cocaine: POSITIVE — AB
Opiates: NOT DETECTED
Tetrahydrocannabinol: POSITIVE — AB

## 2022-08-29 LAB — ACETAMINOPHEN LEVEL: Acetaminophen (Tylenol), Serum: <10 ug/mL — ABNORMAL LOW (ref 10–30)

## 2022-08-29 LAB — SALICYLATE LEVEL: Salicylate Lvl: <7 mg/dL — ABNORMAL LOW (ref 7.0–30.0)

## 2022-08-29 MED ORDER — ALBUTEROL SULFATE (2.5 MG/3ML) 0.083% IN NEBU: 2.5000 mg | INHALATION_SOLUTION | RESPIRATORY_TRACT | Status: DC | PRN

## 2022-08-29 MED ORDER — ACETAMINOPHEN 325 MG PO TABS: 650.0000 mg | ORAL_TABLET | Freq: Four times a day (QID) | ORAL | Status: DC | PRN

## 2022-08-29 MED ORDER — METHOCARBAMOL 500 MG PO TABS
500.0000 mg | ORAL_TABLET | Freq: Four times a day (QID) | ORAL | Status: DC
  Administered 2022-08-29: 500 mg via ORAL
  Filled 2022-08-29: qty 1

## 2022-08-29 MED ORDER — DIPHENHYDRAMINE HCL 25 MG PO TABS: 25.0000 mg | ORAL_TABLET | Freq: Four times a day (QID) | ORAL | Status: DC | PRN

## 2022-08-29 MED ORDER — CLONAZEPAM 0.5 MG PO TABS
1.0000 mg | ORAL_TABLET | Freq: Two times a day (BID) | ORAL | Status: DC
  Administered 2022-08-29: 1 mg via ORAL
  Filled 2022-08-29: qty 2

## 2022-08-29 MED ORDER — MOMETASONE FURO-FORMOTEROL FUM 100-5 MCG/ACT IN AERO: 2.0000 | INHALATION_SPRAY | Freq: Two times a day (BID) | RESPIRATORY_TRACT | Status: DC

## 2022-08-29 MED ORDER — HYDROXYZINE HCL 25 MG PO TABS
25.0000 mg | ORAL_TABLET | Freq: Once | ORAL | Status: AC
  Administered 2022-08-29: 25 mg via ORAL
  Filled 2022-08-29: qty 1

## 2022-08-29 MED ORDER — ALBUTEROL SULFATE HFA 108 (90 BASE) MCG/ACT IN AERS: 2.0000 | INHALATION_SPRAY | RESPIRATORY_TRACT | Status: DC | PRN

## 2022-08-29 MED ORDER — ALBUTEROL SULFATE HFA 108 (90 BASE) MCG/ACT IN AERS
2.0000 | INHALATION_SPRAY | RESPIRATORY_TRACT | Status: DC | PRN
  Administered 2022-08-29: 2 via RESPIRATORY_TRACT
  Filled 2022-08-29: qty 6.7

## 2022-08-29 NOTE — ED Provider Notes (Signed)
[facility] EMERGENCY DEPARTMENT Provider Note   CSN: [id number] Arrival date & time: 08/29/22  7425     History  Chief Complaint  Patient presents with   V70.1    Sherry Cole is a 51 y.o. female.  With past medical history of bipolar, paranoid schizophrenia, PTSD, asthma who presents to the emergency department with suicidal ideation.  Patient states that she was at a doctor's appointment this morning when she told them that she was suicidal.  States that she has been intermittently suicidal for over 30 years but significantly worse over the past week.  States that her job was going to cut her to part-time which likely triggered her symptoms.  States that she has been crying nonstop for 30 hours. She states that she has had thoughts about overdosing on medications such as amitriptyline, gabapentin, Benadryl and has done this and attempt to kill herself previously but "just ended up sleeping for a long time."  She denies homicidal ideation.  She states that she does hear and see people talking with her and frequently talks to herself and "argues with herself," at home.  States she currently takes clonazepam.  Has seen psychiatrist, therapist in the past but states that this is never helped her.  HPI     Home Medications Prior to Admission medications   Medication Sig Start Date End Date Taking? Authorizing Provider  acetaminophen (TYLENOL) 325 MG tablet Take 650 mg by mouth every 6 (six) hours as needed for mild pain or headache.   Yes [provider]  albuterol (PROVENTIL HFA;VENTOLIN HFA) 108 (90 BASE) MCG/ACT inhaler Inhale 2 puffs into the lungs every 4 (four) hours as needed for wheezing or shortness of breath. 10/20/15  Yes Forde Dandy, MD  budesonide-formoterol [facility]) 80-4.5 MCG/ACT inhaler Inhale 2 puffs into the lungs 2 (two) times daily. 01/31/16  Yes Cresenzo-Dishmon, Joaquim Lai, CNM  clonazePAM (KLONOPIN) 1 MG tablet Take 1 mg by mouth 2 (two) times daily.  08/04/22  Yes [provider]  diphenhydrAMINE (BENADRYL) 25 MG tablet Take 25 mg by mouth every 6 (six) hours as needed for allergies, itching or sleep.   Yes [provider]  ferrous sulfate 325 (65 FE) MG tablet Take 325 mg by mouth every 30 (thirty) days.   Yes [provider]  methocarbamol (ROBAXIN) 500 MG tablet Take 500 mg by mouth 4 (four) times daily.   Yes [provider]  traMADol (ULTRAM) 50 MG tablet Take 50 mg by mouth every 6 (six) hours as needed for pain. 08/07/22  Yes [provider]      Allergies    Patient has no known allergies.    Review of Systems   Review of Systems  Psychiatric/Behavioral:  Positive for sleep disturbance and suicidal ideas. The patient is nervous/anxious.   All other systems reviewed and are negative.   Physical Exam Updated Vital Signs BP (!) 172/110   Pulse 86   Temp 98 F (36.7 C) (Oral)   Resp 20   Ht '5\' 4"'$  (1.626 m)   Wt (!) 138.3 kg   LMP 08/22/2022   SpO2 99%   BMI 52.35 kg/m  Physical Exam Vitals and nursing note reviewed.  Constitutional:      Appearance: She is not ill-appearing or toxic-appearing.  HENT:     Head: Normocephalic and atraumatic.  Eyes:     General: No scleral icterus. Cardiovascular:     Pulses: Normal pulses.  Pulmonary:     Effort: Pulmonary  effort is normal. No respiratory distress.  Abdominal:     Palpations: Abdomen is soft.  Skin:    General: Skin is warm and dry.     Capillary Refill: Capillary refill takes less than 2 seconds.     Findings: No rash.  Neurological:     General: No focal deficit present.     Mental Status: She is alert and oriented to person, place, and time. Mental status is at baseline.  Psychiatric:        Attention and Perception: Attention normal.        Mood and Affect: Mood is anxious. Affect is tearful.        Speech: Speech normal.        Behavior: Behavior is cooperative.        Thought Content: Thought content is not  delusional. Thought content includes suicidal ideation. Thought content does not include homicidal ideation. Thought content includes suicidal plan. Thought content does not include homicidal plan.        Cognition and Memory: Cognition and memory normal.        Judgment: Judgment is impulsive and inappropriate.     Comments: Does not appear to be responding to auditory or visual hallucinations or internal stimuli    ED Results / Procedures / Treatments   Labs (all labs ordered are listed, but only abnormal results are displayed) Labs Reviewed  COMPREHENSIVE METABOLIC PANEL - Abnormal; Notable for the following components:      Result Value   Glucose, Bld 106 (*)    Calcium 8.8 (*)    All other components within normal limits  SALICYLATE LEVEL - Abnormal; Notable for the following components:   Salicylate Lvl <2.2 (*)    All other components within normal limits  ACETAMINOPHEN LEVEL - Abnormal; Notable for the following components:   Acetaminophen (Tylenol), Serum <10 (*)    All other components within normal limits  RAPID URINE DRUG SCREEN, HOSP PERFORMED - Abnormal; Notable for the following components:   Cocaine POSITIVE (*)    Benzodiazepines POSITIVE (*)    Tetrahydrocannabinol POSITIVE (*)    All other components within normal limits  RESP PANEL BY RT-PCR (FLU A&B, COVID) ARPGX2  ETHANOL  CBC  POC URINE PREG, ED    EKG None  Radiology No results found.  Procedures Procedures    Medications Ordered in ED Medications  hydrOXYzine (ATARAX) tablet 25 mg (has no administration in time range)  acetaminophen (TYLENOL) tablet 650 mg (has no administration in time range)  albuterol (VENTOLIN HFA) 108 (90 Base) MCG/ACT inhaler 2 puff (has no administration in time range)  mometasone-formoterol (DULERA) 100-5 MCG/ACT inhaler 2 puff (has no administration in time range)  clonazePAM (KLONOPIN) tablet 1 mg (has no administration in time range)  diphenhydrAMINE (BENADRYL)  tablet 25 mg (has no administration in time range)  methocarbamol (ROBAXIN) tablet 500 mg (has no administration in time range)    ED Course/ Medical Decision Making/ A&P                           Medical Decision Making Amount and/or Complexity of Data Reviewed Labs: ordered.  Risk OTC drugs. Prescription drug management.   This patient presents to the ED with chief complaint(s) of suicidal ideation with pertinent past medical history of schizophrenia, bipolar, PTSD which further complicates the presenting complaint. The complaint involves an extensive differential diagnosis and also carries with it a high risk of complications  and morbidity.    The differential diagnosis includes organic etiology of symptoms vs psychiatric   Additional history obtained: Additional history obtained from  none available Records reviewed Care Everywhere/External Records and Primary Care Documents  ED Course and Reassessment: 51 year old female who presents to the emergency department for suicidal ideation.  Physical exam is unremarkable. Labs unremarkable Patient anxious and tearful on exam. Given hydroxyzine '25mg'$   Patient is medically cleared for psychiatric evaluation. Home medications ordered. TTS has been consulted. Appreciate recommendations.   Independent labs interpretation:  The following labs were independently interpreted: cbc nl, cmp nl, ethanol nl, salicylate nl, acetaminophen nl, UDS +cocaine, benzos, THC, resp panel negative.   Independent visualization of imaging: Not indicated   Consultation: - Consulted or discussed management/test interpretation w/ external professional: TTS  Consideration for admission or further workup: per TTS recommendation Social Determinants of health: psychiatric disorders Final Clinical Impression(s) / ED Diagnoses Final diagnoses:  Suicidal ideation    Rx / DC Orders ED Discharge Orders     None         Mickie Hillier, PA-C 08/29/22  1116    Hayden Rasmussen, MD 08/29/22 1733

## 2022-08-29 NOTE — Progress Notes (Addendum)
Chief Complaint  Patient presents with   Hormone Issues    Having menopausal symptoms-pt reports that she is abusive to her family and feels suicidal.       51 y.o. G2P2001 Patient's last menstrual period was 08/22/2022. The current method of family planning is tubal ligation.  Outpatient Encounter Medications as of 08/29/2022  Medication Sig   albuterol (PROVENTIL HFA;VENTOLIN HFA) 108 (90 BASE) MCG/ACT inhaler Inhale 2 puffs into the lungs every 4 (four) hours as needed for wheezing or shortness of breath.   budesonide-formoterol (SYMBICORT) 80-4.5 MCG/ACT inhaler Inhale 2 puffs into the lungs 2 (two) times daily.   clonazePAM (KLONOPIN) 1 MG tablet Take 1 mg by mouth 2 (two) times daily.   methocarbamol (ROBAXIN) 500 MG tablet Take 500 mg by mouth 4 (four) times daily.   [DISCONTINUED] acetaminophen (TYLENOL) 500 MG tablet Take 500 mg by mouth every 6 (six) hours as needed for moderate pain. Reported on 05/29/2016   [DISCONTINUED] butalbital-acetaminophen-caffeine (FIORICET, ESGIC) 50-325-40 MG tablet TAKE ONE TO TWO TABLETS BY MOUTH EVERY 6 HOURS AS NEEDED FOR HEADACHE *LIMIT  TO  6  TABS  IN  24  HOURS*   [DISCONTINUED] butalbital-aspirin-caffeine (FIORINAL) 50-325-40 MG capsule TAKE ONE TO TWO CAPSULES BY MOUTH TWICE DAILY AS NEEDED FOR HEADACHE   [DISCONTINUED] FLUoxetine (PROZAC) 40 MG capsule Take 1 capsule (40 mg total) by mouth daily.   [DISCONTINUED] HYDROcodone-acetaminophen (NORCO) 7.5-325 MG tablet One every four hours for pain as needed.  Do not drive car or operate machinery while taking this medicine.  Must last 14 days.   [DISCONTINUED] methocarbamol (ROBAXIN) 500 MG tablet Take 500 mg by mouth every 6 (six) hours.   [DISCONTINUED] NIFEdipine (PROCARDIA XL/ADALAT-CC) 60 MG 24 hr tablet Take 60 mg by mouth daily.   [DISCONTINUED] nystatin (MYCOSTATIN) 100000 UNIT/ML suspension Take 5 mLs (500,000 Units total) by mouth 4 (four) times daily.   [DISCONTINUED]  omeprazole (PRILOSEC) 20 MG capsule Take 1 capsule (20 mg total) by mouth daily.   [DISCONTINUED] Prenatal Vit-Fe Fumarate-FA (MULTIVITAMIN-PRENATAL) 27-0.8 MG TABS tablet Take 1 tablet by mouth daily at 12 noon.   No facility-administered encounter medications on file as of 08/29/2022.    Subjective Pt made the appointment today for peri menopausal concerns She is still having menses although they have become irregular not like they have been However she is crying hyperkinetic and states she has been suicidal for 6 months worse in the past week Her PHQ-9 score is 27 with definite thoughts of hurting herself, including taking meds She has a significant psychiatric histroy  My concern is for her safety today and we discussed having an acute evaluation by a behavoiral health specialist which in this [place] means an ED encounter I called and talked with Otila Kluver in triage abd the patient agreed to go I had my nurse Tish walk her over for safety  [place] Office Visit from 08/29/2022 in [facility]  PHQ-9 Total Score 26      [facility] Office Visit from 08/29/2022 in [facility]  Thoughts that you would be better off dead, or of hurting yourself in some way Nearly every day  PHQ-9 Total Score 26          08/29/2022    8:42 AM  GAD 7 : Generalized Anxiety Score  Nervous, Anxious, on Edge 3  Control/stop worrying 3  Worry too much - different things 3  Trouble relaxing 3  Restless  3  Easily annoyed or irritable 3  Afraid - awful might happen 3  Total GAD 7 Score 21      Past Medical History:  Diagnosis Date   Asthma    Bipolar 1 disorder (HCC)    Depression    Hydrocephalus (HCC)    Migraine    Paranoid schizophrenia ([place])    Personality disorder ([place])    Psychotic disorder ([place])    PTSD (post-traumatic stress disorder)    Schwannoma     Past Surgical History:  Procedure Laterality Date   CESAREAN SECTION     x2   TUBAL LIGATION       OB History     Gravida  2   Para  2   Term  2   Preterm      AB      Living  1      SAB      IAB      Ectopic      Multiple      Live Births  1           No Known Allergies  Social History   Socioeconomic History   Marital status: Legally Separated    Spouse name: Not on file   Number of children: Not on file   Years of education: Not on file   Highest education level: Not on file  Occupational History   Not on file  Tobacco Use   Smoking status: Never   Smokeless tobacco: Never  Vaping Use   Vaping Use: Never used  Substance and Sexual Activity   Alcohol use: No   Drug use: Yes    Comment: crack over a year ago   Sexual activity: Yes    Partners: Male    Birth control/protection: Surgical, None  Other Topics Concern   Not on file  Social History Narrative   Not on file   Social Determinants of Health   Financial Resource Strain: High Risk (08/29/2022)   Overall Financial Resource Strain (CARDIA)    Difficulty of Paying Living Expenses: Very hard  Food Insecurity: No Food Insecurity (08/29/2022)   Hunger Vital Sign    Worried About Running Out of Food in the Last Year: Never true    Ran Out of Food in the Last Year: Never true  Transportation Needs: No Transportation Needs (08/29/2022)   PRAPARE - Hydrologist (Medical): No    Lack of Transportation (Non-Medical): No  Physical Activity: Inactive (08/29/2022)   Exercise Vital Sign    Days of Exercise per Week: 0 days    Minutes of Exercise per Session: 0 min  Stress: Stress Concern Present (08/29/2022)   [place]    Feeling of Stress : Very much  Social Connections: Socially Isolated (08/29/2022)   Social Connection and Isolation Panel [NHANES]    Frequency of Communication with Friends and Family: Once a week    Frequency of Social Gatherings with Friends and Family: Never    Attends  Religious Services: Never    Marine scientist or Organizations: No    Attends Music therapist: Never    Marital Status: Separated    Family History  Problem Relation Age of Onset   Heart disease Maternal Grandmother    Cancer Maternal Grandmother    Hyperlipidemia Maternal Grandmother    COPD Maternal Grandmother    Hyperlipidemia Maternal Grandfather  Heart disease Mother     Medications:       Current Outpatient Medications:    albuterol (PROVENTIL HFA;VENTOLIN HFA) 108 (90 BASE) MCG/ACT inhaler, Inhale 2 puffs into the lungs every 4 (four) hours as needed for wheezing or shortness of breath., Disp: 1 Inhaler, Rfl: 1   budesonide-formoterol (SYMBICORT) 80-4.5 MCG/ACT inhaler, Inhale 2 puffs into the lungs 2 (two) times daily., Disp: 1 Inhaler, Rfl: 12   clonazePAM (KLONOPIN) 1 MG tablet, Take 1 mg by mouth 2 (two) times daily., Disp: , Rfl:    methocarbamol (ROBAXIN) 500 MG tablet, Take 500 mg by mouth 4 (four) times daily., Disp: , Rfl:    acetaminophen (TYLENOL) 325 MG tablet, Take 650 mg by mouth every 6 (six) hours as needed for mild pain or headache., Disp: , Rfl:    diphenhydrAMINE (BENADRYL) 25 MG tablet, Take 25 mg by mouth every 6 (six) hours as needed for allergies, itching or sleep., Disp: , Rfl:    ferrous sulfate 325 (65 FE) MG tablet, Take 325 mg by mouth every 30 (thirty) days., Disp: , Rfl:    traMADol (ULTRAM) 50 MG tablet, Take 50 mg by mouth every 6 (six) hours as needed for pain., Disp: , Rfl:  No current facility-administered medications for this visit.  Facility-Administered Medications Ordered in Other Visits:    acetaminophen (TYLENOL) tablet 650 mg, 650 mg, Oral, Q6H PRN, Erskine Speed, Lauren E, PA-C   albuterol (VENTOLIN HFA) 108 (90 Base) MCG/ACT inhaler 2 puff, 2 puff, Inhalation, Q4H PRN, Hayden Rasmussen, MD, 2 puff at 08/29/22 1226   clonazePAM (KLONOPIN) tablet 1 mg, 1 mg, Oral, BID, Autry, Lauren E, PA-C, 1 mg at 08/29/22 1508    diphenhydrAMINE (BENADRYL) tablet 25 mg, 25 mg, Oral, Q6H PRN, Erskine Speed, Lauren E, PA-C   methocarbamol (ROBAXIN) tablet 500 mg, 500 mg, Oral, QID, Autry, Lauren E, PA-C, 500 mg at 08/29/22 1507   mometasone-formoterol (DULERA) 100-5 MCG/ACT inhaler 2 puff, 2 puff, Inhalation, BID, Erskine Speed, Lauren E, PA-C  Objective Blood pressure 128/82, pulse 73, last menstrual period 08/22/2022, currently breastfeeding.   Obese female crying, distraught hyperkinetic  Pertinent ROS Per HPI  Labs or studies     Impression + Management Plan: Diagnoses this Encounter::   ICD-10-CM   1. Suicidal ideations  R45.851     2. History of psychiatric disorder  Z86.59         Medications prescribed during  this encounter: No orders of the defined types were placed in this encounter.   Labs or Scans Ordered during this encounter: No orders of the defined types were placed in this encounter.     Follow up Will follow up based on her acute evaluation Return if symptoms worsen or fail to improve.

## 2022-08-29 NOTE — Consult Note (Signed)
Telepsych Consultation   Reason for Consult:  suicidal ideations Referring Physician:  Hayden Rasmussen, MD Location of Patient:  [facility] Location of Provider: [facility]  Patient Identification: Sherry Cole MRN:  [id number] Principal Diagnosis: Adjustment disorder with mixed anxiety and depressed mood Diagnosis:  Principal Problem:   Adjustment disorder with mixed anxiety and depressed mood Active Problems:   Cocaine use disorder ([place])   Substance induced mood disorder ([place])   Total Time spent with patient: 30 minutes  Subjective:   Sherry Cole is a 51 y.o. female patient admitted with suicidal ideations.  "Bored. I've been here a very long time". Patient presents alert and oriented, brightens on approach. Congruent affect. Linear, logical thought content. She reports going to OBGYN this morning for what she believed were perimenopausal symptoms ie increased crying, irritability where she completed a screening with questions regarding suicidal thoughts. She reports she was "being honest" regarding having the passive thoughts "for years" but denies any active thoughts or any intent/plan. She is requesting to leave as she believes it was a misunderstanding and has to return to work. She insists on having no suicidal/homicidal thoughts. Identifies her children as motivating/protective factors. Has active supports including NA sponsor, "sobriety network", parents; requesting resources for outpatient therapy. Provider discussed UDS results and effects of cocaine on mental health; patient acknowledged an understanding. Provided verbal permission to speak to person for collateral information.   Collateral: david [phone] "I've been talking to her all week about how she's feeling and all she's been complaining about is being emotional. Never anything about hurting herself. I don't have any concerns. She's just been blaming it on her women problems. I  don't believe she's a threat to herself or anyone". Denies any safety concerns.   HPI:  Sherry Cole is a 51 year old female with a past psychiatric history of  who presented voluntarily to [facility] from her doctors office after voicing suicidal ideations. From [place], [place]; prior service in [place]. Reports she lost her job and was homeless for 7 years but never had any intent or plans to harm herself. UDS+cocaine, THC, BAL<10.   Past Psychiatric History: personality disorder, depression, PTSD, bipolar I disorder, psychotic disorder  Risk to Self:  pt denies Risk to Others:  pt denies Prior Inpatient Therapy:   Prior Outpatient Therapy:    Past Medical History:  Past Medical History:  Diagnosis Date   Asthma    Bipolar 1 disorder ([place])    Depression    Hydrocephalus ([place])    Migraine    Paranoid schizophrenia ([place])    Personality disorder ([place])    Psychotic disorder ([place])    PTSD (post-traumatic stress disorder)    Schwannoma     Past Surgical History:  Procedure Laterality Date   CESAREAN SECTION     x2   TUBAL LIGATION     Family History:  Family History  Problem Relation Age of Onset   Heart disease Maternal Grandmother    Cancer Maternal Grandmother    Hyperlipidemia Maternal Grandmother    COPD Maternal Grandmother    Hyperlipidemia Maternal Grandfather    Heart disease Mother    Family Psychiatric History: not noted Social History:  Social History   Substance and Sexual Activity  Alcohol Use No     Social History   Substance and Sexual Activity  Drug Use Yes   Comment: crack over a year ago    Social History   Socioeconomic History  Marital status: Legally Separated    Spouse name: Not on file   Number of children: Not on file   Years of education: Not on file   Highest education level: Not on file  Occupational History   Not on file  Tobacco Use   Smoking status: Never   Smokeless tobacco: Never  Vaping Use   Vaping Use: Never used   Substance and Sexual Activity   Alcohol use: No   Drug use: Yes    Comment: crack over a year ago   Sexual activity: Yes    Partners: Male    Birth control/protection: Surgical, None  Other Topics Concern   Not on file  Social History Narrative   Not on file   Social Determinants of Health   Financial Resource Strain: High Risk (08/29/2022)   Overall Financial Resource Strain (CARDIA)    Difficulty of Paying Living Expenses: Very hard  Food Insecurity: No Food Insecurity (08/29/2022)   Hunger Vital Sign    Worried About Running Out of Food in the Last Year: Never true    Ran Out of Food in the Last Year: Never true  Transportation Needs: No Transportation Needs (08/29/2022)   PRAPARE - Hydrologist (Medical): No    Lack of Transportation (Non-Medical): No  Physical Activity: Inactive (08/29/2022)   Exercise Vital Sign    Days of Exercise per Week: 0 days    Minutes of Exercise per Session: 0 min  Stress: Stress Concern Present (08/29/2022)   [place]    Feeling of Stress : Very much  Social Connections: Socially Isolated (08/29/2022)   Social Connection and Isolation Panel [NHANES]    Frequency of Communication with Friends and Family: Once a week    Frequency of Social Gatherings with Friends and Family: Never    Attends Religious Services: Never    Marine scientist or Organizations: No    Attends Archivist Meetings: Never    Marital Status: Separated   Additional Social History:    Allergies:  No Known Allergies  Labs:  Results for orders placed or performed during the hospital encounter of 08/29/22 (from the past 48 hour(s))  Comprehensive metabolic panel     Status: Abnormal   Collection Time: 08/29/22 10:11 AM  Result Value Ref Range   Sodium 139 135 - 145 mmol/L   Potassium 3.8 3.5 - 5.1 mmol/L   Chloride 109 98 - 111 mmol/L   CO2 22 22 - 32  mmol/L   Glucose, Bld 106 (H) 70 - 99 mg/dL    Comment: Glucose reference range applies only to samples taken after fasting for at least 8 hours.   BUN 7 6 - 20 mg/dL   Creatinine, Ser 0.64 0.44 - 1.00 mg/dL   Calcium 8.8 (L) 8.9 - 10.3 mg/dL   Total Protein 7.1 6.5 - 8.1 g/dL   Albumin 3.9 3.5 - 5.0 g/dL   AST 23 15 - 41 U/L   ALT 26 0 - 44 U/L   Alkaline Phosphatase 60 38 - 126 U/L   Total Bilirubin 0.5 0.3 - 1.2 mg/dL   GFR, Estimated >60 >60 mL/min    Comment: (NOTE) Calculated using the CKD-EPI Creatinine Equation (2021)    Anion gap 8 5 - 15    Comment: Performed at [facility], [address], [place], [place] [address]  Ethanol     Status: None   Collection  Time: 08/29/22 10:11 AM  Result Value Ref Range   Alcohol, Ethyl (B) <10 <10 mg/dL    Comment: (NOTE) Lowest detectable limit for serum alcohol is 10 mg/dL.  For medical purposes only. Performed at [facility], [address], [place], [place] [address]   Salicylate level     Status: Abnormal   Collection Time: 08/29/22 10:11 AM  Result Value Ref Range   Salicylate Lvl <4.1 (L) 7.0 - 30.0 mg/dL    Comment: Performed at [facility], [address], [place], [place] [address]  Acetaminophen level     Status: Abnormal   Collection Time: 08/29/22 10:11 AM  Result Value Ref Range   Acetaminophen (Tylenol), Serum <10 (L) 10 - 30 ug/mL    Comment: (NOTE) Therapeutic concentrations vary significantly. A range of 10-30 ug/mL  may be an effective concentration for many patients. However, some  are best treated at concentrations outside of this range. Acetaminophen concentrations >150 ug/mL at 4 hours after ingestion  and >50 ug/mL at 12 hours after ingestion are often associated with  toxic reactions.  Performed at [facility], [address] [place], [place] [address]   cbc     Status: None   Collection Time: 08/29/22 10:11 AM  Result Value Ref Range   WBC 8.3 4.0 - 10.5 K/uL   RBC 4.51 3.87 - 5.11 MIL/uL    Hemoglobin 13.4 12.0 - 15.0 g/dL   HCT 40.1 36.0 - 46.0 %   MCV 88.9 80.0 - 100.0 fL   MCH 29.7 26.0 - 34.0 pg   MCHC 33.4 30.0 - 36.0 g/dL   RDW 13.2 11.5 - 15.5 %   Platelets 369 150 - 400 K/uL   nRBC 0.0 0.0 - 0.2 %    Comment: Performed at [facility], [address], [place], [place] [address]  Rapid urine drug screen (hospital performed)     Status: Abnormal   Collection Time: 08/29/22 10:30 AM  Result Value Ref Range   Opiates NONE DETECTED NONE DETECTED   Cocaine POSITIVE (A) NONE DETECTED   Benzodiazepines POSITIVE (A) NONE DETECTED   Amphetamines NONE DETECTED NONE DETECTED   Tetrahydrocannabinol POSITIVE (A) NONE DETECTED   Barbiturates NONE DETECTED NONE DETECTED    Comment: (NOTE) DRUG SCREEN FOR MEDICAL PURPOSES ONLY.  IF CONFIRMATION IS NEEDED FOR ANY PURPOSE, NOTIFY LAB WITHIN 5 DAYS.  LOWEST DETECTABLE LIMITS FOR URINE DRUG SCREEN Drug Class                     Cutoff (ng/mL) Amphetamine and metabolites    1000 Barbiturate and metabolites    200 Benzodiazepine                 200 Opiates and metabolites        300 Cocaine and metabolites        300 THC                            50 Performed at [facility], [address] [place],  [address]   Resp Panel by RT-PCR (Flu A&B, Covid) Urine, Clean Catch     Status: None   Collection Time: 08/29/22 10:30 AM   Specimen: Urine, Clean Catch; Nasal Swab  Result Value Ref Range   SARS Coronavirus 2 by RT PCR NEGATIVE NEGATIVE    Comment: (NOTE) SARS-CoV-2 target nucleic acids are NOT DETECTED.  The SARS-CoV-2 RNA is generally detectable in  upper respiratory specimens during the acute phase of infection. The lowest concentration of SARS-CoV-2 viral copies this assay can detect is 138 copies/mL. A negative result does not preclude SARS-Cov-2 infection and should not be used as the sole basis for treatment or other patient management decisions. A negative result may occur with  improper specimen  collection/handling, submission of specimen other than nasopharyngeal swab, presence of viral mutation(s) within the areas targeted by this assay, and inadequate number of viral copies(<138 copies/mL). A negative result must be combined with clinical observations, patient history, and epidemiological information. The expected result is Negative.  Fact Sheet for Patients:  EntrepreneurPulse.com.au  Fact Sheet for Healthcare Providers:  IncredibleEmployment.be  This test is no t yet approved or cleared by the [place] FDA and  has been authorized for detection and/or diagnosis of SARS-CoV-2 by FDA under an Emergency Use Authorization (EUA). This EUA will remain  in effect (meaning this test can be used) for the duration of the COVID-19 declaration under Section 564(b)(1) of the Act, 21 U.S.C.section 360bbb-3(b)(1), unless the authorization is terminated  or revoked sooner.       Influenza A by PCR NEGATIVE NEGATIVE   Influenza B by PCR NEGATIVE NEGATIVE    Comment: (NOTE) The Xpert Xpress SARS-CoV-2/FLU/RSV plus assay is intended as an aid in the diagnosis of influenza from Nasopharyngeal swab specimens and should not be used as a sole basis for treatment. Nasal washings and aspirates are unacceptable for Xpert Xpress SARS-CoV-2/FLU/RSV testing.  Fact Sheet for Patients: EntrepreneurPulse.com.au  Fact Sheet for Healthcare Providers: IncredibleEmployment.be  This test is not yet approved or cleared by the [place] FDA and has been authorized for detection and/or diagnosis of SARS-CoV-2 by FDA under an Emergency Use Authorization (EUA). This EUA will remain in effect (meaning this test can be used) for the duration of the COVID-19 declaration under Section 564(b)(1) of the Act, 21 U.S.C. section 360bbb-3(b)(1), unless the authorization is terminated or revoked.  Performed at [facility],  [address], [place], [place] [address]     Medications:  Current Facility-Administered Medications  Medication Dose Route Frequency Provider Last Rate Last Admin   acetaminophen (TYLENOL) tablet 650 mg  650 mg Oral Q6H PRN Mickie Hillier, PA-C       albuterol (VENTOLIN HFA) 108 (90 Base) MCG/ACT inhaler 2 puff  2 puff Inhalation Q4H PRN Hayden Rasmussen, MD   2 puff at 08/29/22 1226   clonazePAM (KLONOPIN) tablet 1 mg  1 mg Oral BID Mickie Hillier, PA-C   1 mg at 08/29/22 1508   diphenhydrAMINE (BENADRYL) tablet 25 mg  25 mg Oral Q6H PRN Mickie Hillier, PA-C       methocarbamol (ROBAXIN) tablet 500 mg  500 mg Oral QID Mickie Hillier, PA-C   500 mg at 08/29/22 1507   mometasone-formoterol (DULERA) 100-5 MCG/ACT inhaler 2 puff  2 puff Inhalation BID Mickie Hillier, PA-C       Current Outpatient Medications  Medication Sig Dispense Refill   acetaminophen (TYLENOL) 325 MG tablet Take 650 mg by mouth every 6 (six) hours as needed for mild pain or headache.     albuterol (PROVENTIL HFA;VENTOLIN HFA) 108 (90 BASE) MCG/ACT inhaler Inhale 2 puffs into the lungs every 4 (four) hours as needed for wheezing or shortness of breath. 1 Inhaler 1   budesonide-formoterol (SYMBICORT) 80-4.5 MCG/ACT inhaler Inhale 2 puffs into the lungs 2 (two) times daily. 1 Inhaler 12   clonazePAM (KLONOPIN) 1  MG tablet Take 1 mg by mouth 2 (two) times daily.     diphenhydrAMINE (BENADRYL) 25 MG tablet Take 25 mg by mouth every 6 (six) hours as needed for allergies, itching or sleep.     ferrous sulfate 325 (65 FE) MG tablet Take 325 mg by mouth every 30 (thirty) days.     methocarbamol (ROBAXIN) 500 MG tablet Take 500 mg by mouth 4 (four) times daily.     traMADol (ULTRAM) 50 MG tablet Take 50 mg by mouth every 6 (six) hours as needed for pain.      Musculoskeletal: Strength & Muscle Tone: within normal limits Gait & Station: normal Patient leans: N/A  Psychiatric Specialty Exam:  Presentation  General  Appearance: Casual  Eye Contact:Good  Speech:Clear and Coherent  Speech Volume:Normal  Handedness:Right  Mood and Affect  Mood:Dysphoric  Affect:Congruent; Appropriate  Thought Process  Thought Processes:Coherent; Goal Directed; Linear  Descriptions of Associations:Intact  Orientation:Full (Time, Place and Person)  Thought Content:Logical  History of Schizophrenia/Schizoaffective disorder:No data recorded Duration of Psychotic Symptoms:No data recorded Hallucinations:Hallucinations: None  Ideas of Reference:None  Suicidal Thoughts:Suicidal Thoughts: No  Homicidal Thoughts:Homicidal Thoughts: No  Sensorium  Memory:Immediate Good; Recent Good  Judgment:Fair  Insight:Fair; Present  Executive Functions  Concentration:Fair  Attention Span:Fair  [place]  Psychomotor Activity  Psychomotor Activity:Psychomotor Activity: Normal  Assets  Assets:Communication Skills; Desire for Improvement; Housing; Intimacy; Financial Resources/Insurance; Physical Health; Resilience; Transportation; Talents/Skills; Social Support; Vocational/Educational   Sleep  Sleep:Sleep: Good  Physical Exam: Physical Exam Vitals and nursing note reviewed.  Constitutional:      General: She is not in acute distress.    Appearance: She is not toxic-appearing.  HENT:     Head: Normocephalic.     Nose: Nose normal.     Mouth/Throat:     Mouth: Mucous membranes are moist.     Pharynx: Oropharynx is clear.  Eyes:     Pupils: Pupils are equal, round, and reactive to light.  Cardiovascular:     Rate and Rhythm: Normal rate.     Pulses: Normal pulses.     Comments: Elevated bp Pulmonary:     Effort: Pulmonary effort is normal.  Abdominal:     Palpations: Abdomen is soft.  Musculoskeletal:        General: Normal range of motion.  Skin:    General: Skin is dry.  Neurological:     Mental Status: She is alert and oriented to person,  place, and time.  Psychiatric:        Attention and Perception: She does not perceive auditory or visual hallucinations.        Mood and Affect: Mood normal. Affect is blunt.        Speech: Speech normal.        Behavior: Behavior is cooperative.        Thought Content: Thought content is not paranoid or delusional. Thought content does not include homicidal or suicidal ideation. Thought content does not include homicidal or suicidal plan.        Cognition and Memory: Cognition and memory normal.        Judgment: Judgment normal.    Review of Systems  Psychiatric/Behavioral:  Positive for depression and substance abuse. Negative for suicidal ideas.   All other systems reviewed and are negative.  Blood pressure (!) 172/110, pulse 86, temperature 98 F (36.7 C), temperature source Oral, resp. rate 20, height '5\' 4"'$  (1.626 m), weight (!) 138.3  kg, last menstrual period 08/22/2022, SpO2 99 %, currently breastfeeding. Body mass index is 52.35 kg/m.  Treatment Plan Summary: Plan patient denies any active thoughts, intent or plan to harm herself; contracts for safety. Collateral contact denies any concerns for safety. Outpatient resources provided included [facility], [facility]. Plan to discharge patient with outpatient resources for individual follow up.   Disposition: No evidence of imminent risk to self or others at present.   Patient does not meet criteria for psychiatric inpatient admission. Supportive therapy provided about ongoing stressors. Discussed crisis plan, support from social network, calling 911, coming to the Emergency Department, and calling Suicide Hotline.  This service was provided via telemedicine using a 2-way, interactive audio and video technology.  Names of all persons participating in this telemedicine service and their role in this encounter. Name: Oneida Alar Role: PMHNP  Name: Hampton Abbot Role: attending MD  Name: Duwayne Heck Role: patient   Name:  Role:     Inda Merlin, NP 08/29/2022 3:16 PM

## 2022-08-29 NOTE — ED Triage Notes (Signed)
Pt escorted here from Dr. Brynda Greathouse office today.  Says she was there for hormonal therapy and told them she was suicidal.  Pt tearful.  Reports has been suicidal for "a very long time."  Says has tried cutting herself and  overdosing, many times."  She said she was told it may not be her hormones and wants her to be seen.  Pt states, "I can't stop crying.  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  Reports history of homelessness and history of mental illness.

## 2022-08-29 NOTE — Discharge Instructions (Signed)
You were seen in the emergency department today for suicidal ideations.  You were evaluated by our psychiatric team and they would like you to follow-up with [facility].  Please take your medications as prescribed.  Please return to the emergency department behavioral health urgent care for suicidal or homicidal ideations or hearing voices or seeing things that may not be there.

## 2022-08-29 NOTE — ED Notes (Signed)
Pt speaking with TTS 

## 2022-08-30 ENCOUNTER — Other Ambulatory Visit: Payer: Self-pay

## 2022-08-30 ENCOUNTER — Encounter (HOSPITAL_COMMUNITY): Payer: Self-pay

## 2022-08-30 ENCOUNTER — Emergency Department (HOSPITAL_COMMUNITY)
Admission: EM | Admit: 2022-08-30 | Discharge: 2022-08-31 | Disposition: A | Discharge status: Home / Self Care | Payer: 59 | Attending: Emergency Medicine | Admitting: Emergency Medicine

## 2022-08-30 DIAGNOSIS — F1420 Cocaine dependence, uncomplicated: Secondary | ICD-10-CM | POA: Insufficient documentation

## 2022-08-30 DIAGNOSIS — R45851 Suicidal ideations: Secondary | ICD-10-CM

## 2022-08-30 DIAGNOSIS — Z046 Encounter for general psychiatric examination, requested by authority: Secondary | ICD-10-CM | POA: Diagnosis present

## 2022-08-30 DIAGNOSIS — F1914 Other psychoactive substance abuse with psychoactive substance-induced mood disorder: Secondary | ICD-10-CM | POA: Diagnosis not present

## 2022-08-30 DIAGNOSIS — Z1152 Encounter for screening for COVID-19: Secondary | ICD-10-CM | POA: Insufficient documentation

## 2022-08-30 DIAGNOSIS — F4323 Adjustment disorder with mixed anxiety and depressed mood: Secondary | ICD-10-CM | POA: Insufficient documentation

## 2022-08-30 LAB — ETHANOL: Alcohol, Ethyl (B): <10 mg/dL (ref <10)

## 2022-08-30 LAB — COMPREHENSIVE METABOLIC PANEL
ALT: 25 U/L (ref 0–44)
AST: 24 U/L (ref 15–41)
Albumin: 4 g/dL (ref 3.5–5.0)
Alkaline Phosphatase: 54 U/L (ref 38–126)
Anion gap: 6 (ref 5–15)
BUN: 8 mg/dL (ref 6–20)
CO2: 24 mmol/L (ref 22–32)
Calcium: 9.3 mg/dL (ref 8.9–10.3)
Chloride: 110 mmol/L (ref 98–111)
Creatinine, Ser: 0.65 mg/dL (ref 0.44–1.00)
GFR, Estimated: >60 mL/min (ref >60)
Glucose, Bld: 106 mg/dL — ABNORMAL HIGH (ref 70–99)
Potassium: 3.4 mmol/L — ABNORMAL LOW (ref 3.5–5.1)
Sodium: 140 mmol/L (ref 135–145)
Total Bilirubin: 0.8 mg/dL (ref 0.3–1.2)
Total Protein: 7.3 g/dL (ref 6.5–8.1)

## 2022-08-30 LAB — RESP PANEL BY RT-PCR (FLU A&B, COVID) ARPGX2
Influenza A by PCR: NEGATIVE
Influenza B by PCR: NEGATIVE
SARS Coronavirus 2 by RT PCR: NEGATIVE

## 2022-08-30 LAB — CBC
HCT: 41.7 % (ref 36.0–46.0)
Hemoglobin: 13.9 g/dL (ref 12.0–15.0)
MCH: 30.2 pg (ref 26.0–34.0)
MCHC: 33.3 g/dL (ref 30.0–36.0)
MCV: 90.5 fL (ref 80.0–100.0)
Platelets: 365 10*3/uL (ref 150–400)
RBC: 4.61 MIL/uL (ref 3.87–5.11)
RDW: 13.1 % (ref 11.5–15.5)
WBC: 8 10*3/uL (ref 4.0–10.5)
nRBC: 0 % (ref 0.0–0.2)

## 2022-08-30 LAB — RAPID URINE DRUG SCREEN, HOSP PERFORMED
Amphetamines: NOT DETECTED
Barbiturates: NOT DETECTED
Benzodiazepines: POSITIVE — AB
Cocaine: POSITIVE — AB
Opiates: NOT DETECTED
Tetrahydrocannabinol: POSITIVE — AB

## 2022-08-30 LAB — ACETAMINOPHEN LEVEL: Acetaminophen (Tylenol), Serum: <10 ug/mL — ABNORMAL LOW (ref 10–30)

## 2022-08-30 LAB — POC URINE PREG, ED: Preg Test, Ur: NEGATIVE

## 2022-08-30 LAB — SALICYLATE LEVEL: Salicylate Lvl: <7 mg/dL — ABNORMAL LOW (ref 7.0–30.0)

## 2022-08-30 MED ORDER — CLONAZEPAM 0.5 MG PO TABS
1.0000 mg | ORAL_TABLET | Freq: Two times a day (BID) | ORAL | Status: DC
  Administered 2022-08-30 – 2022-08-31 (×2): 1 mg via ORAL
  Filled 2022-08-30 (×2): qty 2

## 2022-08-30 MED ORDER — DIPHENHYDRAMINE HCL 25 MG PO CAPS: 25.0000 mg | ORAL_CAPSULE | Freq: Four times a day (QID) | ORAL | Status: DC | PRN

## 2022-08-30 MED ORDER — ALBUTEROL SULFATE HFA 108 (90 BASE) MCG/ACT IN AERS
2.0000 | INHALATION_SPRAY | Freq: Four times a day (QID) | RESPIRATORY_TRACT | Status: DC | PRN
  Administered 2022-08-30: 2 via RESPIRATORY_TRACT
  Filled 2022-08-30: qty 6.7

## 2022-08-30 MED ORDER — FLUTICASONE FUROATE-VILANTEROL 100-25 MCG/ACT IN AEPB
1.0000 | INHALATION_SPRAY | Freq: Every day | RESPIRATORY_TRACT | Status: DC
  Administered 2022-08-31: 1 via RESPIRATORY_TRACT
  Filled 2022-08-30: qty 28

## 2022-08-30 NOTE — ED Notes (Signed)
Pt back from TTS

## 2022-08-30 NOTE — ED Triage Notes (Signed)
Pt reports feeling suicidal. Pt reports that since she was released yesterday she has tried to overdose on "crack." She stated she was going to take all of her OTC medications but she had a clarity moment and took all the meds and put them in a roommates room.

## 2022-08-30 NOTE — ED Notes (Signed)
Pt asked to use inhaler again. Educated pt that her inhaler is to be used every 6 hours. Pt nodded in [place]

## 2022-08-30 NOTE — ED Notes (Signed)
Our NP, [name] recommends inpatient psychiatric care for patient

## 2022-08-30 NOTE — ED Notes (Addendum)
Family updated as to patient's status. Mother Richmond Campbell Viering per pt's permission  Per mother, pt has been SI on and off for the past 10 years. More specifically pt has been SI past 5 days. Mother encouraged pt to call suicidal hotline. Hotline directed pt to come to the ER. While pt was on the phone with mother, after calling the hotline, pt told mother that she "might as well drive off the road and hit a tree". Other methods pt has stated to mother is to OD on drugs.
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
## 2022-08-30 NOTE — ED Notes (Signed)
Patient brought back from triage, this nurse tech informed patient of [facility] hospital policy and gave pt burgundy scrubs. Pt changed out into appropriate hospital attire. Patients belongings secured and placed in safety lockers.   Patients belongings consist of the following: pocketbook with keys attached, pants, shirt, bra, underwear, and flats(shoes). Patients belongings placed in row 2 (top to bottom) locker two. 1 bag.   Urine sample collected at this time as well. Patient is calm and cooperative at this time. Patient located in hallway 7 and is sitting on stretcher. Nurse notified at this time.

## 2022-08-30 NOTE — ED Provider Notes (Signed)
[facility] EMERGENCY DEPARTMENT Provider Note   CSN: [id number] Arrival date & time: 08/30/22  1234     History {Add pertinent medical, surgical, social history, OB history to HPI:1} Chief Complaint  Patient presents with   Suicidal   Addiction Problem    "Crack"    Sherry Cole is a 51 y.o. female.  Patient states that she wants to hurt herself.  She has been having suicidal ideations.  She also has a history of substance abuse  The history is provided by the patient and medical records. No language interpreter was used.  Altered Mental Status Presenting symptoms: behavior changes   Severity:  Severe Most recent episode:  More than 2 days ago Episode history:  Continuous Timing:  Constant Progression:  Waxing and waning Chronicity:  Recurrent Context: not alcohol use   Associated symptoms: no abdominal pain, no hallucinations, no headaches, no rash and no seizures        Home Medications Prior to Admission medications   Medication Sig Start Date End Date Taking? Authorizing Provider  acetaminophen (TYLENOL) 325 MG tablet Take 650 mg by mouth every 6 (six) hours as needed for mild pain or headache.    [provider]  albuterol (PROVENTIL HFA;VENTOLIN HFA) 108 (90 BASE) MCG/ACT inhaler Inhale 2 puffs into the lungs every 4 (four) hours as needed for wheezing or shortness of breath. 10/20/15   Forde Dandy, MD  budesonide-formoterol [facility]) 80-4.5 MCG/ACT inhaler Inhale 2 puffs into the lungs 2 (two) times daily. 01/31/16   Cresenzo-Dishmon, Joaquim Lai, CNM  clonazePAM (KLONOPIN) 1 MG tablet Take 1 mg by mouth 2 (two) times daily. 08/04/22   [provider]  diphenhydrAMINE (BENADRYL) 25 MG tablet Take 25 mg by mouth every 6 (six) hours as needed for allergies, itching or sleep.    [provider]  ferrous sulfate 325 (65 FE) MG tablet Take 325 mg by mouth every 30 (thirty) days.    [provider]  methocarbamol (ROBAXIN) 500 MG  tablet Take 500 mg by mouth 4 (four) times daily.    [provider]  traMADol (ULTRAM) 50 MG tablet Take 50 mg by mouth every 6 (six) hours as needed for pain. 08/07/22   [provider]      Allergies    Patient has no known allergies.    Review of Systems   Review of Systems  Constitutional:  Negative for appetite change and fatigue.  HENT:  Negative for congestion, ear discharge and sinus pressure.   Eyes:  Negative for discharge.  Respiratory:  Negative for cough.   Cardiovascular:  Negative for chest pain.  Gastrointestinal:  Negative for abdominal pain and diarrhea.  Genitourinary:  Negative for frequency and hematuria.  Musculoskeletal:  Negative for back pain.  Skin:  Negative for rash.  Neurological:  Negative for seizures and headaches.  Psychiatric/Behavioral:  Positive for suicidal ideas. Negative for hallucinations.     Physical Exam Updated Vital Signs BP (!) 159/115 (BP Location: Right Wrist)   Pulse 82   Temp 98.5 F (36.9 C) (Oral)   Resp (!) 22   Ht '5\' 4"'$  (1.626 m)   Wt (!) 138.3 kg   LMP 08/22/2022   SpO2 96%   BMI 52.35 kg/m  Physical Exam Vitals and nursing note reviewed.  Constitutional:      Appearance: She is well-developed.  HENT:     Head: Normocephalic.     Nose: Nose normal.  Eyes:     General: No  scleral icterus.    Conjunctiva/sclera: Conjunctivae normal.  Neck:     Thyroid: No thyromegaly.  Cardiovascular:     Rate and Rhythm: Normal rate and regular rhythm.     Heart sounds: No murmur heard.    No friction rub. No gallop.  Pulmonary:     Breath sounds: No stridor. No wheezing or rales.  Chest:     Chest wall: No tenderness.  Abdominal:     General: There is no distension.     Tenderness: There is no abdominal tenderness. There is no rebound.  Musculoskeletal:        General: Normal range of motion.     Cervical back: Neck supple.  Lymphadenopathy:     Cervical: No cervical adenopathy.  Skin:    Findings:  No erythema or rash.  Neurological:     Mental Status: She is alert and oriented to person, place, and time.     Motor: No abnormal muscle tone.     Coordination: Coordination normal.  Psychiatric:     Comments: Suicidal     ED Results / Procedures / Treatments   Labs (all labs ordered are listed, but only abnormal results are displayed) Labs Reviewed  COMPREHENSIVE METABOLIC PANEL - Abnormal; Notable for the following components:      Result Value   Potassium 3.4 (*)    Glucose, Bld 106 (*)    All other components within normal limits  SALICYLATE LEVEL - Abnormal; Notable for the following components:   Salicylate Lvl <6.2 (*)    All other components within normal limits  ACETAMINOPHEN LEVEL - Abnormal; Notable for the following components:   Acetaminophen (Tylenol), Serum <10 (*)    All other components within normal limits  RAPID URINE DRUG SCREEN, HOSP PERFORMED - Abnormal; Notable for the following components:   Cocaine POSITIVE (*)    Benzodiazepines POSITIVE (*)    Tetrahydrocannabinol POSITIVE (*)    All other components within normal limits  RESP PANEL BY RT-PCR (FLU A&B, COVID) ARPGX2  ETHANOL  CBC  POC URINE PREG, ED    EKG None  Radiology No results found.  Procedures Procedures  {Document cardiac monitor, telemetry assessment procedure when appropriate:1}  Medications Ordered in ED Medications - No data to display  ED Course/ Medical Decision Making/ A&P                           Medical Decision Making Amount and/or Complexity of Data Reviewed Labs: ordered.   Patient with suicidal ideations.  She is medically cleared.  She will be evaluated by behavioral health  {Document critical care time when appropriate:1} {Document review of labs and clinical decision tools ie heart score, Chads2Vasc2 etc:1}  {Document your independent review of radiology images, and any outside records:1} {Document your discussion with family members, caretakers, and  with consultants:1} {Document social determinants of health affecting pt's care:1} {Document your decision making why or why not admission, treatments were needed:1} Final Clinical Impression(s) / ED Diagnoses Final diagnoses:  None    Rx / DC Orders ED Discharge Orders     None

## 2022-08-30 NOTE — ED Notes (Signed)
Pt with TTS 

## 2022-08-30 NOTE — ED Notes (Signed)
Pt is requesting her albuterol inhaler at this time. EDP made aware

## 2022-08-30 NOTE — ED Provider Notes (Addendum)
   Clinical Course as of 08/30/22 2122  Sat Aug 30, 2022  1526 Signed out pending behavioral health assessment [WS]  2109 Patient medically cleared. Placed in psychiatric boarding status. Asked for home inhaler, provided with albuterol inhaler. No sign of asthma exacerbation.  [WS]  2121 Psychiatry recommends inpatient psychiatric admission [WS]    Clinical Course User Index [WS] Cristie Hem, MD   Medical Decision Making Amount and/or Complexity of Data Reviewed Labs: ordered.  Risk Prescription drug management.         Cristie Hem, MD 08/30/22 2112    Cristie Hem, MD 08/30/22 2122

## 2022-08-30 NOTE — ED Notes (Signed)
Pt given ice water.

## 2022-08-30 NOTE — ED Notes (Signed)
Pt took 2 puffs of albuterol inhaler and then immediately laid down in the bed. Pt was thankful and appreciative.

## 2022-08-30 NOTE — ED Notes (Signed)
Pt's sister called. This RN could not communicate anything with sister as sister is not on pt's contact list. Pt is currently asleep at this time to ask for permission. Not waking pt up to promote therapeutic sleep

## 2022-08-31 ENCOUNTER — Emergency Department (HOSPITAL_COMMUNITY): Payer: 59

## 2022-08-31 MED ORDER — BUDESONIDE 0.25 MG/2ML IN SUSP
RESPIRATORY_TRACT | Status: AC
  Filled 2022-08-31: qty 2

## 2022-08-31 MED ORDER — ACETAMINOPHEN 325 MG PO TABS
650.0000 mg | ORAL_TABLET | Freq: Four times a day (QID) | ORAL | Status: DC | PRN
  Administered 2022-08-31: 650 mg via ORAL
  Filled 2022-08-31: qty 2

## 2022-08-31 MED ORDER — DM-GUAIFENESIN ER 30-600 MG PO TB12
1.0000 | ORAL_TABLET | Freq: Two times a day (BID) | ORAL | Status: DC | PRN
  Administered 2022-08-31: 1 via ORAL
  Filled 2022-08-31: qty 1

## 2022-08-31 NOTE — Progress Notes (Signed)
Per Isaias Cowman admission, pt has been accepted to [facility] [place] [facility] unit. Accepting provider is Dr. Meyer Russel. Patient can arrive anytime. Number for report is [phone]. Please call Robbi/[phone] before  call report.    Glennie Isle, MSW, LCSW-A Phone: [phone] Disposition/TOC

## 2022-08-31 NOTE — ED Notes (Signed)
Pt was given breakfast tray 

## 2022-08-31 NOTE — ED Notes (Signed)
Pt is back in the room at this time.

## 2022-08-31 NOTE — ED Notes (Signed)
Pt reports new green productive cough and lung pain due to persistent coughing spells. MD notified; will continue to monitor and will await orders.

## 2022-08-31 NOTE — ED Provider Notes (Signed)
Emergency Medicine Observation Re-evaluation Note  Sherry Cole is a 51 y.o. female, seen on rounds today.  Pt initially presented to the ED for complaints of Suicidal and Addiction Problem ("Crack") Currently, the patient is awaiting psychiatric inpatient admission.  Patient evaluated by behavioral health yesterday.Marland Kitchen  Physical Exam  BP (!) 160/120 (BP Location: Right Arm)   Pulse 80   Temp 97.7 F (36.5 C) (Oral)   Resp 16   Ht 1.626 m ('5\' 4"'$ )   Wt (!) 138.3 kg   LMP 08/22/2022   SpO2 98%   BMI 52.35 kg/m  Physical Exam General: Nontoxic no acute distress Cardiac: Regular rate and rhythm Lungs: Rhonchi Psych: Resting suicidal ideation  ED Course / MDM  EKG:   I have reviewed the labs performed to date as well as medications administered while in observation.  Recent changes in the last 24 hours include patient still with coughing was given albuterol inhaler yesterday.  We will get chest x-ray today and ordered Mucinex DM for patient as well as Tylenol.  Patient's COVID testing was negative yesterday.  Plan  Current plan is for psychiatric inpatient admission awaiting placement.    Fredia Sorrow, MD 08/31/22 [phone]

## 2022-08-31 NOTE — Progress Notes (Signed)
Per [name], NP, patient meets criteria for inpatient treatment. There are no available beds at [facility] today. CSW faxed referrals to the following facilities for review:  [address] [place] [address] [phone] --  [place] [facility] [address] [place] [address] [phone] --  [facility] [address] [place] [address] [phone] --  [place] [address] [place] [address] [phone] --  [place]  Pending - Request Sent N/A [place], [place] [address] [phone] --  [place] [address] [place] [address] [phone] --  [facility]  Pending - Request Sent N/A [address] [place] [address] [place] [address] [place] [address] [phone] --  [facility]  Pending - Request Sent N/A [address] [place] [address] [phone] --  [facility]  Pending - Request Sent N/A [address] [place] [address] [phone] --  [facility]  Pending - Request Sent N/A [place], [place] [address] [phone] --  [facility]  Pending - Request Sent N/A [address], [place] [address] [phone] --  [facility]  Pending - Request Sent N/A [address] [place] [address] [phone] --  [facility]  Pending - Request Sent N/A [address]  [place] [address] [phone] --   TTS will continue to seek bed placement.  Glennie Isle, MSW, [place] Phone: [phone] Disposition/TOC

## 2022-08-31 NOTE — ED Notes (Signed)
Pt is in the shower at this time.

## 2022-08-31 NOTE — ED Notes (Signed)
Per Isaias Cowman admission, pt has been accepted to [facility] [facility] unit. Accepting provider is Dr. Meyer Russel. Patient can arrive anytime. Number for report is [phone]. Please call Robbi/[phone] before to give report.

## 2022-09-18 ENCOUNTER — Ambulatory Visit: Payer: 59 | Admitting: Adult Health

## 2022-09-24 ENCOUNTER — Ambulatory Visit (INDEPENDENT_AMBULATORY_CARE_PROVIDER_SITE_OTHER): Payer: 59 | Admitting: Adult Health

## 2022-09-24 ENCOUNTER — Encounter: Payer: Self-pay | Admitting: Adult Health

## 2022-09-24 VITALS — BP 118/73 | HR 70 | Ht 64.0 in | Wt 287.0 lb

## 2022-09-24 DIAGNOSIS — G479 Sleep disorder, unspecified: Secondary | ICD-10-CM | POA: Diagnosis not present

## 2022-09-24 DIAGNOSIS — R232 Flushing: Secondary | ICD-10-CM | POA: Diagnosis not present

## 2022-09-24 DIAGNOSIS — R4589 Other symptoms and signs involving emotional state: Secondary | ICD-10-CM | POA: Diagnosis not present

## 2022-09-24 DIAGNOSIS — N951 Menopausal and female climacteric states: Secondary | ICD-10-CM | POA: Diagnosis not present

## 2022-09-24 MED ORDER — PROGESTERONE MICRONIZED 100 MG PO CAPS: ORAL_CAPSULE | ORAL | 3 refills | Status: DC

## 2022-09-24 NOTE — Progress Notes (Signed)
Subjective:     Patient ID: Sherry Cole, female   DOB: 1971-01-15, 51 y.o.   MRN: [id number]  HPI Sherry Cole is a 51 year old white female, separated, G2P2001, in complaining of hot flashes, trouble sleeping, and mood swings and more migraines, with aura and nausea and vomiting and veritgo. And periods vary from 3-7 days. She spent a week at [facility] and is meds now and has appt with psychiatrist and therapist today,feels less depressed.  PCP is Dr Bartolo Darter.  Review of Systems See HPI for positives She says she is feeling less depressed Was explosive, but not now    Reviewed past medical,surgical, social and family history. Reviewed medications and allergies.  Objective:   Physical Exam BP 118/73 (BP Location: Right Arm, Patient Position: Sitting, Cuff Size: Normal)   Pulse 70   Ht '5\' 4"'$  (1.626 m)   Wt 287 lb (130.2 kg)   LMP 09/18/2022   Breastfeeding No   BMI 49.26 kg/m   Skin warm and dry. Lungs: clear to ausculation bilaterally. Cardiovascular: regular rate and rhythm.    AA is 0 Fall risk is low    09/24/2022    9:39 AM 08/29/2022    8:42 AM  Depression screen PHQ 2/9  Decreased Interest 0 3  Down, Depressed, Hopeless 1 3  PHQ - 2 Score 1 6  Altered sleeping 1 3  Tired, decreased energy 1 3  Change in appetite 0 3  Feeling bad or failure about yourself  1 3  Trouble concentrating 2 3  Moving slowly or fidgety/restless 1 2  Suicidal thoughts 0 3  PHQ-9 Score 7 26  Difficult doing work/chores Not difficult at all        09/24/2022    9:40 AM 08/29/2022    8:42 AM  GAD 7 : Generalized Anxiety Score  Nervous, Anxious, on Edge 2 3  Control/stop worrying 1 3  Worry too much - different things 1 3  Trouble relaxing 1 3  Restless 2 3  Easily annoyed or irritable 0 3  Afraid - awful might happen 0 3  Total GAD 7 Score 7 21  Anxiety Difficulty Not difficult at all       Upstream - 09/24/22 3818       Pregnancy Intention Screening   Does the patient  want to become pregnant in the next year? No    Does the patient's partner want to become pregnant in the next year? No    Would the patient like to discuss contraceptive options today? No      Contraception Wrap Up   Current Method Female Sterilization    End Method Female Sterilization    Contraception Counseling Provided No             Assessment:     1. Perimenopause Will try Prometrium  100 mg at HS, discussed with Dr Nelda Marseille  Meds ordered this encounter  Medications   progesterone (PROMETRIUM) 100 MG capsule    Sig: Take 1 daily at bedtime    Dispense:  30 capsule    Refill:  3    Order Specific Question:   Supervising Provider    Answer:   Florian Buff [[id number]]   Review handout   2. Hot flashes Will try Prometrium   3. Moody Follow up with psychiatrist   4. Sleep disturbance Will try Prometrium     Plan:     Follow up in 4 weeks    Has pap and  physical 11/11/22

## 2022-10-13 ENCOUNTER — Emergency Department (HOSPITAL_COMMUNITY)
Admission: EM | Admit: 2022-10-13 | Discharge: 2022-10-14 | Disposition: A | Discharge status: Home / Self Care | Payer: 59 | Attending: Emergency Medicine | Admitting: Emergency Medicine

## 2022-10-13 ENCOUNTER — Encounter (HOSPITAL_COMMUNITY): Payer: Self-pay

## 2022-10-13 DIAGNOSIS — J45909 Unspecified asthma, uncomplicated: Secondary | ICD-10-CM | POA: Diagnosis not present

## 2022-10-13 DIAGNOSIS — R45851 Suicidal ideations: Secondary | ICD-10-CM | POA: Diagnosis not present

## 2022-10-13 DIAGNOSIS — G43909 Migraine, unspecified, not intractable, without status migrainosus: Secondary | ICD-10-CM | POA: Insufficient documentation

## 2022-10-13 DIAGNOSIS — Z20822 Contact with and (suspected) exposure to covid-19: Secondary | ICD-10-CM | POA: Diagnosis not present

## 2022-10-13 DIAGNOSIS — F4323 Adjustment disorder with mixed anxiety and depressed mood: Secondary | ICD-10-CM | POA: Insufficient documentation

## 2022-10-13 DIAGNOSIS — F329 Major depressive disorder, single episode, unspecified: Secondary | ICD-10-CM | POA: Diagnosis not present

## 2022-10-13 DIAGNOSIS — Z046 Encounter for general psychiatric examination, requested by authority: Secondary | ICD-10-CM | POA: Diagnosis present

## 2022-10-13 LAB — COMPREHENSIVE METABOLIC PANEL
ALT: 22 U/L (ref 0–44)
AST: 17 U/L (ref 15–41)
Albumin: 3.7 g/dL (ref 3.5–5.0)
Alkaline Phosphatase: 61 U/L (ref 38–126)
Anion gap: 7 (ref 5–15)
BUN: 10 mg/dL (ref 6–20)
CO2: 23 mmol/L (ref 22–32)
Calcium: 9.6 mg/dL (ref 8.9–10.3)
Chloride: 108 mmol/L (ref 98–111)
Creatinine, Ser: 0.65 mg/dL (ref 0.44–1.00)
GFR, Estimated: >60 mL/min (ref >60)
Glucose, Bld: 160 mg/dL — ABNORMAL HIGH (ref 70–99)
Potassium: 3.4 mmol/L — ABNORMAL LOW (ref 3.5–5.1)
Sodium: 138 mmol/L (ref 135–145)
Total Bilirubin: 0.5 mg/dL (ref 0.3–1.2)
Total Protein: 6.9 g/dL (ref 6.5–8.1)

## 2022-10-13 LAB — CBC WITH DIFFERENTIAL/PLATELET
Abs Immature Granulocytes: 0.04 10*3/uL (ref 0.00–0.07)
Basophils Absolute: 0.1 10*3/uL (ref 0.0–0.1)
Basophils Relative: 0 %
Eosinophils Absolute: 0.3 10*3/uL (ref 0.0–0.5)
Eosinophils Relative: 2 %
HCT: 39 % (ref 36.0–46.0)
Hemoglobin: 13.2 g/dL (ref 12.0–15.0)
Immature Granulocytes: 0 %
Lymphocytes Relative: 27 %
Lymphs Abs: 3.5 10*3/uL (ref 0.7–4.0)
MCH: 30.1 pg (ref 26.0–34.0)
MCHC: 33.8 g/dL (ref 30.0–36.0)
MCV: 89 fL (ref 80.0–100.0)
Monocytes Absolute: 1 10*3/uL (ref 0.1–1.0)
Monocytes Relative: 8 %
Neutro Abs: 7.9 10*3/uL — ABNORMAL HIGH (ref 1.7–7.7)
Neutrophils Relative %: 63 %
Platelets: 297 10*3/uL (ref 150–400)
RBC: 4.38 MIL/uL (ref 3.87–5.11)
RDW: 12.8 % (ref 11.5–15.5)
WBC: 12.7 10*3/uL — ABNORMAL HIGH (ref 4.0–10.5)
nRBC: 0 % (ref 0.0–0.2)

## 2022-10-13 LAB — RAPID URINE DRUG SCREEN, HOSP PERFORMED
Amphetamines: NOT DETECTED
Barbiturates: NOT DETECTED
Benzodiazepines: NOT DETECTED
Cocaine: POSITIVE — AB
Opiates: NOT DETECTED
Tetrahydrocannabinol: POSITIVE — AB

## 2022-10-13 LAB — PREGNANCY, URINE: Preg Test, Ur: NEGATIVE

## 2022-10-13 LAB — ETHANOL: Alcohol, Ethyl (B): <10 mg/dL (ref <10)

## 2022-10-13 LAB — RESP PANEL BY RT-PCR (FLU A&B, COVID) ARPGX2
Influenza A by PCR: NEGATIVE
Influenza B by PCR: NEGATIVE
SARS Coronavirus 2 by RT PCR: NEGATIVE

## 2022-10-13 MED ORDER — LACTATED RINGERS IV BOLUS
1000.0000 mL | Freq: Once | INTRAVENOUS | Status: AC
  Administered 2022-10-13: 1000 mL via INTRAVENOUS

## 2022-10-13 MED ORDER — METOCLOPRAMIDE HCL 5 MG/ML IJ SOLN
10.0000 mg | Freq: Once | INTRAMUSCULAR | Status: AC
  Administered 2022-10-13: 10 mg via INTRAVENOUS
  Filled 2022-10-13: qty 2

## 2022-10-13 MED ORDER — ALBUTEROL SULFATE HFA 108 (90 BASE) MCG/ACT IN AERS
2.0000 | INHALATION_SPRAY | RESPIRATORY_TRACT | Status: DC | PRN
  Administered 2022-10-13 – 2022-10-14 (×4): 2 via RESPIRATORY_TRACT
  Filled 2022-10-13: qty 6.7

## 2022-10-13 MED ORDER — DIPHENHYDRAMINE HCL 50 MG/ML IJ SOLN
12.5000 mg | Freq: Once | INTRAMUSCULAR | Status: AC
  Administered 2022-10-13: 12.5 mg via INTRAVENOUS
  Filled 2022-10-13: qty 1

## 2022-10-13 MED ORDER — KETOROLAC TROMETHAMINE 15 MG/ML IJ SOLN
15.0000 mg | Freq: Once | INTRAMUSCULAR | Status: AC
  Administered 2022-10-13: 15 mg via INTRAVENOUS
  Filled 2022-10-13: qty 1

## 2022-10-13 NOTE — ED Triage Notes (Signed)
Pt arrives via POV with c/o severe anxiety and panic attacks, states she began having suicidal thoughts this morning that scared her and she decided to come to the ED. Pt denies having a plan, states "I began to think if I killed myself I wouldn't have to deal with all of this panic and anxiety." Denies SI at this time. Reports hx of SI and attempts.

## 2022-10-13 NOTE — ED Provider Notes (Signed)
[facility] EMERGENCY DEPARTMENT Provider Note   CSN: [id number] Arrival date & time: 10/13/22  1019     History  Chief Complaint  Patient presents with   Psychiatric Evaluation    Sherry Cole is a 51 y.o. female.  HPI Patient presents for anxiety and suicidal ideation.  Medical history includes asthma, bipolar disorder, depression, migraines, cocaine use disorder.  She has a history of anxiety and multiple suicide attempts.  This morning, suicidal ideation increased to the point that she came to the ED.  Currently, she endorses a headache that is typical for her migraine headaches.  She denies any other current physical symptoms.  She denies any alcohol use.  Last cocaine use was 2 or 3 days ago.  She states that she has been taking her prescribed medications.    Home Medications Prior to Admission medications   Medication Sig Start Date End Date Taking? Authorizing Provider  acetaminophen (TYLENOL) 325 MG tablet Take 650 mg by mouth every 6 (six) hours as needed for mild pain or headache.   Yes [provider]  ADVAIR DISKUS 100-50 MCG/ACT AEPB 1 puff 2 (two) times daily. 09/04/22  Yes [provider]  albuterol (PROVENTIL HFA;VENTOLIN HFA) 108 (90 BASE) MCG/ACT inhaler Inhale 2 puffs into the lungs every 4 (four) hours as needed for wheezing or shortness of breath. 10/20/15  Yes Forde Dandy, MD  ARIPiprazole (ABILIFY) 10 MG tablet Take 10 mg by mouth daily. 09/15/22  Yes [provider]  atorvastatin (LIPITOR) 40 MG tablet Take 40 mg by mouth daily. 09/15/22  Yes [provider]  azelastine (ASTELIN) 0.1 % nasal spray Place 1 spray into both nostrils 2 (two) times daily. 09/15/22  Yes [provider]  clonazePAM (KLONOPIN) 1 MG tablet Take 1 mg by mouth 2 (two) times daily. 08/04/22  Yes [provider]  ferrous sulfate 325 (65 FE) MG tablet Take 325 mg by mouth every 30 (thirty) days.   Yes [provider]   FLUoxetine (PROZAC) 10 MG capsule Take 10 mg by mouth daily.   Yes [provider]  guanFACINE (TENEX) 1 MG tablet Take 1 mg by mouth at bedtime. 09/15/22  Yes [provider]  hydrOXYzine (VISTARIL) 25 MG capsule Take 25 mg by mouth at bedtime as needed. 09/15/22  Yes [provider]  progesterone (PROMETRIUM) 100 MG capsule Take 1 daily at bedtime 09/24/22  Yes Derrek Monaco A, NP  amoxicillin (AMOXIL) 500 MG capsule Take 500 mg by mouth 3 (three) times daily. Patient not taking: Reported on 10/13/2022 09/18/22   [provider]  budesonide-formoterol (SYMBICORT) 80-4.5 MCG/ACT inhaler Inhale 2 puffs into the lungs 2 (two) times daily. Patient not taking: Reported on 09/24/2022 01/31/16   Cresenzo-Dishmon, Joaquim Lai, CNM  traMADol (ULTRAM) 50 MG tablet Take 50 mg by mouth every 6 (six) hours as needed for pain. Patient not taking: Reported on 09/24/2022 08/07/22   [provider]      Allergies    Patient has no known allergies.    Review of Systems   Review of Systems  Neurological:  Positive for headaches.  Psychiatric/Behavioral:  Positive for suicidal ideas. The patient is nervous/anxious.   All other systems reviewed and are negative.   Physical Exam Updated Vital Signs BP (!) 152/83 (BP Location: Right Arm)   Pulse 76   Temp 98.1 F (36.7 C) (Oral)   Resp 16   Ht '5\' 4"'$  (1.626 m)   Wt 129.3 kg  LMP 09/18/2022   SpO2 100%   BMI 48.92 kg/m  Physical Exam Vitals and nursing note reviewed.  Constitutional:      General: She is not in acute distress.    Appearance: Normal appearance. She is well-developed. She is not ill-appearing, toxic-appearing or diaphoretic.  HENT:     Head: Normocephalic and atraumatic.     Right Ear: External ear normal.     Left Ear: External ear normal.     Nose: Nose normal.     Mouth/Throat:     Mouth: Mucous membranes are moist.  Eyes:     Extraocular Movements: Extraocular movements intact.      Conjunctiva/sclera: Conjunctivae normal.  Cardiovascular:     Rate and Rhythm: Normal rate and regular rhythm.  Pulmonary:     Effort: Pulmonary effort is normal. No respiratory distress.  Abdominal:     General: There is no distension.  Musculoskeletal:        General: No swelling. Normal range of motion.     Cervical back: Normal range of motion and neck supple.     Right lower leg: No edema.     Left lower leg: No edema.  Skin:    General: Skin is warm and dry.     Coloration: Skin is not jaundiced or pale.  Neurological:     General: No focal deficit present.     Mental Status: She is alert and oriented to person, place, and time.  Psychiatric:        Attention and Perception: Perception normal.        Mood and Affect: Affect normal. Mood is anxious.        Speech: Speech is not rapid and pressured or slurred.        Behavior: Behavior normal. Behavior is cooperative.        Thought Content: Thought content includes suicidal ideation.     ED Results / Procedures / Treatments   Labs (all labs ordered are listed, but only abnormal results are displayed) Labs Reviewed  COMPREHENSIVE METABOLIC PANEL - Abnormal; Notable for the following components:      Result Value   Potassium 3.4 (*)    Glucose, Bld 160 (*)    All other components within normal limits  RAPID URINE DRUG SCREEN, HOSP PERFORMED - Abnormal; Notable for the following components:   Cocaine POSITIVE (*)    Tetrahydrocannabinol POSITIVE (*)    All other components within normal limits  CBC WITH DIFFERENTIAL/PLATELET - Abnormal; Notable for the following components:   WBC 12.7 (*)    Neutro Abs 7.9 (*)    All other components within normal limits  RESP PANEL BY RT-PCR (FLU A&B, COVID) ARPGX2  ETHANOL  PREGNANCY, URINE    EKG EKG Interpretation  Date/Time:  Monday October 13 2022 12:31:35 EST Ventricular Rate:  69 PR Interval:  160 QRS Duration: 96 QT Interval:  400 QTC Calculation: 428 R  Axis:   77 Text Interpretation: Normal sinus rhythm Confirmed by Godfrey Pick ([id number]) on 10/13/2022 12:52:10 PM  Radiology No results found.  Procedures Procedures    Medications Ordered in ED Medications  ketorolac (TORADOL) 15 MG/ML injection 15 mg (15 mg Intravenous Given 10/13/22 1214)  metoCLOPramide (REGLAN) injection 10 mg (10 mg Intravenous Given 10/13/22 1213)  diphenhydrAMINE (BENADRYL) injection 12.5 mg (12.5 mg Intravenous Given 10/13/22 1215)  lactated ringers bolus 1,000 mL (1,000 mLs Intravenous Bolus 10/13/22 1219)    ED Course/ Medical Decision Making/ A&P  Medical Decision Making Amount and/or Complexity of Data Reviewed Labs: ordered.  Risk Prescription drug management.   This patient presents to the ED for concern of anxiety and suicidal ideation, this involves an extensive number of treatment options, and is a complaint that carries with it a high risk of complications and morbidity.  The differential diagnosis includes intoxication, drug withdrawal, worsened psychosocial stressors   Co morbidities that complicate the patient evaluation  asthma, bipolar disorder, depression, migraines, cocaine use disorder   Additional history obtained:  Additional history obtained from N/A External records from outside source obtained and reviewed including EMR   Lab Tests:  I Ordered, and personally interpreted labs.  The pertinent results include: UDS positive for cocaine and THC.  Mild leukocytosis.  Lab work otherwise unremarkable.   Cardiac Monitoring: / EKG:  The patient was maintained on a cardiac monitor.  I personally viewed and interpreted the cardiac monitored which showed an underlying rhythm of: Sinus rhythm   Consultations Obtained:  I requested consultation with the TTS,  and discussed lab and imaging findings as well as pertinent plan - they recommend: (Pending)   Problem List / ED Course / Critical interventions /  Medication management  Patient presents for worsening Lorenz Coaster with persistent suicidal ideations today.  Vital signs on arrival notable for moderate hypertension.  Patient endorses cocaine use 2 to 3 days ago but denies any other recent alcohol or illicit drug use.  She does endorse a current migraine headache.  On exam, she is well-appearing.  She has no focal neurologic deficits.  Medical screening workup was initiated.  Patient was provided with headache cocktail and had resolution of her headache symptoms.  UDS is positive for cocaine and THC.  She does have a mild leukocytosis but lab work is otherwise unremarkable.  Patient is medically cleared at this time.  Given her persistent SI, patient to undergo TTS evaluation.  Remains in the ED voluntarily. I ordered medication including Toradol, IV fluids, Reglan, Benadryl for headache Reevaluation of the patient after these medicines showed that the patient resolved I have reviewed the patients home medicines and have made adjustments as needed   Social Determinants of Health:  History of psychiatric illness, history of polysubstance abuse         Final Clinical Impression(s) / ED Diagnoses Final diagnoses:  Suicidal ideation    Rx / DC Orders ED Discharge Orders     None         Godfrey Pick, MD 10/13/22 1536

## 2022-10-13 NOTE — ED Notes (Signed)
Pt dressed put in appropriate maroon scrubs, security wanded pt, pt belongings locked up in locker

## 2022-10-14 DIAGNOSIS — R45851 Suicidal ideations: Secondary | ICD-10-CM

## 2022-10-14 DIAGNOSIS — F4323 Adjustment disorder with mixed anxiety and depressed mood: Secondary | ICD-10-CM

## 2022-10-14 MED ORDER — FLUOXETINE HCL 10 MG PO CAPS
10.0000 mg | ORAL_CAPSULE | Freq: Every day | ORAL | Status: DC
  Administered 2022-10-14: 10 mg via ORAL
  Filled 2022-10-14: qty 1

## 2022-10-14 MED ORDER — HYDROXYZINE PAMOATE 25 MG PO CAPS: 25.0000 mg | ORAL_CAPSULE | Freq: Three times a day (TID) | ORAL | Status: DC | PRN

## 2022-10-14 MED ORDER — POTASSIUM CHLORIDE CRYS ER 20 MEQ PO TBCR
20.0000 meq | EXTENDED_RELEASE_TABLET | Freq: Once | ORAL | Status: AC
  Administered 2022-10-14: 20 meq via ORAL
  Filled 2022-10-14: qty 1
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
## 2022-10-14 NOTE — ED Notes (Signed)
Pt is reporting that pain from her migraine, that she was initially seen for, has subsided. She does not have any intentions of harming herself or anyone else. She reports feeling anxious because of having errands to complete and wishes to leave because of that, but she is willing to stay to complete further assessments.

## 2022-10-14 NOTE — Consult Note (Signed)
Telepsych Consultation   Reason for Consult: Psych consult Referring Physician: Dr. Doren Custard Location of Patient: [id number] Location of Provider: [facility]  Patient Identification: Sherry Cole  MRN:  [id number]  Principal Diagnosis: Adjustment disorder with mixed anxiety and depressed mood.  Diagnosis: Suicidal ideation, in the context of migraine headache x 2 days and anxiety  Total Time spent with patient: 30 minutes  Subjective:   Sherry Cole is a 51 y.o. female patient admitted with SI in the context of migraine headache x 2 days and anxiety.  Assessment: Patient presents sitting in her bed at [facility], ED room.  She appears calm and pleasant and participating in the exam.  Alert and oriented x 4 speech clear, coherent with normal volume and pattern.  Maintained good eye contact with the provider.  Presents with euthymic mood and congruent affect.  Patient reports being overwhelmed x 2 days due to migraine headache, anxiety and feeling suicidal.  Added, "I did not want it to get worse like my previous suicidal attempt when OD on crack cocaine, so I decided to come to the hospital voluntarily for help.  I received a migraine cocktail yesterday and I feel fine. No more suicidal thoughts or anxiety."  Patient able to contract for safety.  No signs of psychosis observed during encounter.  Did not seem to be responding to internal or external stimuli during assessment.  Abnormal labs 10/13/2022 reviewed, CMP: Potassium 3.4 (L), replaced with 20 mEq of potassium chloride x 1, glucose: 160 Increase p.o. fluid intake encouraged.  Urine drug screen: Positive for cocaine and tetrahydrocannabinol.  Instructions provided on cessation of polysubstance usage due to adverse effect on overall psychiatric and medical wellbeing patient nods in agreement.  She denies SI HI, HI, or AVH.  Denies paranoia or delusions.  Reports sleeping for 6 hours last night, and reports good  appetite further reports being safe at home and no access to firearms.  Patient denies self-injurious behavior of family history of mental illness.  She endorses use of crack cocaine of $40 worth daily, denies alcohol drinking, endorses use of marijuana products of 1 bowl daily.  Instruction provided as indicated above during assessment.  Reported having a therapist appointment today and states she will reschedule for another day.  Report having a psychiatric appointment tomorrow.  HPI:  Patient presents for anxiety and suicidal ideation.  Medical history includes asthma, bipolar disorder, depression, migraines, cocaine use disorder.  She has a history of anxiety and multiple suicide attempts.  This morning, suicidal ideation increased to the point that she came to the ED.  Currently, she endorses a headache that is typical for her migraine headaches.  She denies any other current physical symptoms.  She denies any alcohol use.  Last cocaine use was 2 or 3 days ago.  She states that she has been taking her prescribed medications.   Past Psychiatric History: Adjustment disorder with mixed anxiety and depressed mood, cocaine use disorder, history of bipolar 1 disorder, history of paranoid schizophrenia, personality disorder, history of psychotic disorder, history of PTSD.  Ad[facility] to [facility] in October 2023 treatment due to OD on crack cocaine.  Disposition: Patient is psych cleared and can be discharged home when medically stable.  Safety and crisis plans discussed with patient.  Supportive therapy provided about ongoing stressors.  Support from social network, calling 911 going to the nearest emergency department and calling suicide hotline if patient experiences crisis.  Risk to Self:  No Risk to  Others:  No Prior Inpatient Therapy:  Yes Prior Outpatient Therapy:  Yes  Past Medical History:  Past Medical History:  Diagnosis Date   Asthma    Bipolar 1 disorder ([place])    Depression     Hydrocephalus (HCC)    Migraine    Paranoid schizophrenia ([place])    Personality disorder (HCC)    Psychotic disorder (HCC)    PTSD (post-traumatic stress disorder)    Schwannoma     Past Surgical History:  Procedure Laterality Date   CESAREAN SECTION     x2   TUBAL LIGATION     Family History:  Family History  Problem Relation Age of Onset   Heart disease Maternal Grandmother    Cancer Maternal Grandmother    Hyperlipidemia Maternal Grandmother    COPD Maternal Grandmother    Hyperlipidemia Maternal Grandfather    Heart disease Mother    Family Psychiatric  History: None indicated  Social History:  Social History   Substance and Sexual Activity  Alcohol Use No     Social History   Substance and Sexual Activity  Drug Use Yes   Types: Cocaine, Marijuana   Comment: crack - last used 3 nights ago; last used THC last PM    Social History   Socioeconomic History   Marital status: Legally Separated    Spouse name: Not on file   Number of children: Not on file   Years of education: Not on file   Highest education level: Not on file  Occupational History   Not on file  Tobacco Use   Smoking status: Never   Smokeless tobacco: Never  Vaping Use   Vaping Use: Never used  Substance and Sexual Activity   Alcohol use: No   Drug use: Yes    Types: Cocaine, Marijuana    Comment: crack - last used 3 nights ago; last used THC last PM   Sexual activity: Yes    Partners: Male    Birth control/protection: Surgical    Comment: tubal  Other Topics Concern   Not on file  Social History Narrative   Not on file   Social Determinants of Health   Financial Resource Strain: High Risk (08/29/2022)   Overall Financial Resource Strain (CARDIA)    Difficulty of Paying Living Expenses: Very hard  Food Insecurity: No Food Insecurity (08/29/2022)   Hunger Vital Sign    Worried About Running Out of Food in the Last Year: Never true    Ran Out of Food in the Last Year: Never true   Transportation Needs: No Transportation Needs (08/29/2022)   PRAPARE - Hydrologist (Medical): No    Lack of Transportation (Non-Medical): No  Physical Activity: Inactive (08/29/2022)   Exercise Vital Sign    Days of Exercise per Week: 0 days    Minutes of Exercise per Session: 0 min  Stress: Stress Concern Present (08/29/2022)   [place]    Feeling of Stress : Very much  Social Connections: Socially Isolated (08/29/2022)   Social Connection and Isolation Panel [NHANES]    Frequency of Communication with Friends and Family: Once a week    Frequency of Social Gatherings with Friends and Family: Never    Attends Religious Services: Never    Marine scientist or Organizations: No    Attends Archivist Meetings: Never    Marital Status: Separated   Additional Social History:  Allergies:  No Known Allergies  Labs:  Results for orders placed or performed during the hospital encounter of 10/13/22 (from the past 48 hour(s))  Urine rapid drug screen (hosp performed)     Status: Abnormal   Collection Time: 10/13/22 10:51 AM  Result Value Ref Range   Opiates NONE DETECTED NONE DETECTED   Cocaine POSITIVE (A) NONE DETECTED   Benzodiazepines NONE DETECTED NONE DETECTED   Amphetamines NONE DETECTED NONE DETECTED   Tetrahydrocannabinol POSITIVE (A) NONE DETECTED   Barbiturates NONE DETECTED NONE DETECTED    Comment: (NOTE) DRUG SCREEN FOR MEDICAL PURPOSES ONLY.  IF CONFIRMATION IS NEEDED FOR ANY PURPOSE, NOTIFY LAB WITHIN 5 DAYS.  LOWEST DETECTABLE LIMITS FOR URINE DRUG SCREEN Drug Class                     Cutoff (ng/mL) Amphetamine and metabolites    1000 Barbiturate and metabolites    200 Benzodiazepine                 200 Opiates and metabolites        300 Cocaine and metabolites        300 THC                            50 Performed at [facility], [address] [place], [place] [address]   Comprehensive metabolic panel     Status: Abnormal   Collection Time: 10/13/22 11:31 AM  Result Value Ref Range   Sodium 138 135 - 145 mmol/L   Potassium 3.4 (L) 3.5 - 5.1 mmol/L   Chloride 108 98 - 111 mmol/L   CO2 23 22 - 32 mmol/L   Glucose, Bld 160 (H) 70 - 99 mg/dL    Comment: Glucose reference range applies only to samples taken after fasting for at least 8 hours.   BUN 10 6 - 20 mg/dL   Creatinine, Ser 0.65 0.44 - 1.00 mg/dL   Calcium 9.6 8.9 - 10.3 mg/dL   Total Protein 6.9 6.5 - 8.1 g/dL   Albumin 3.7 3.5 - 5.0 g/dL   AST 17 15 - 41 U/L   ALT 22 0 - 44 U/L   Alkaline Phosphatase 61 38 - 126 U/L   Total Bilirubin 0.5 0.3 - 1.2 mg/dL   GFR, Estimated >60 >60 mL/min    Comment: (NOTE) Calculated using the CKD-EPI Creatinine Equation (2021)    Anion gap 7 5 - 15    Comment: Performed at [facility], [address] [place], [place] [address]  Ethanol     Status: None   Collection Time: 10/13/22 11:31 AM  Result Value Ref Range   Alcohol, Ethyl (B) <10 <10 mg/dL    Comment: (NOTE) Lowest detectable limit for serum alcohol is 10 mg/dL.  For medical purposes only. Performed at [facility], [address], [place], [place] [address]   CBC with Diff     Status: Abnormal   Collection Time: 10/13/22 11:31 AM  Result Value Ref Range   WBC 12.7 (H) 4.0 - 10.5 K/uL   RBC 4.38 3.87 - 5.11 MIL/uL   Hemoglobin 13.2 12.0 - 15.0 g/dL   HCT 39.0 36.0 - 46.0 %   MCV 89.0 80.0 - 100.0 fL   MCH 30.1 26.0 - 34.0 pg   MCHC 33.8 30.0 - 36.0 g/dL   RDW 12.8 11.5 - 15.5 %   Platelets 297 150 - 400  K/uL   nRBC 0.0 0.0 - 0.2 %   Neutrophils Relative % 63 %   Neutro Abs 7.9 (H) 1.7 - 7.7 K/uL   Lymphocytes Relative 27 %   Lymphs Abs 3.5 0.7 - 4.0 K/uL   Monocytes Relative 8 %   Monocytes Absolute 1.0 0.1 - 1.0 K/uL   Eosinophils Relative 2 %   Eosinophils Absolute 0.3 0.0 - 0.5 K/uL   Basophils Relative 0 %   Basophils Absolute 0.1 0.0 - 0.1 K/uL    Immature Granulocytes 0 %   Abs Immature Granulocytes 0.04 0.00 - 0.07 K/uL    Comment: Performed at [facility], [address] [place], [place] [address]  Resp Panel by RT-PCR (Flu A&B, Covid) Urine, Clean Catch     Status: None   Collection Time: 10/13/22 11:43 AM   Specimen: Urine, Clean Catch; Nasal Swab  Result Value Ref Range   SARS Coronavirus 2 by RT PCR NEGATIVE NEGATIVE    Comment: (NOTE) SARS-CoV-2 target nucleic acids are NOT DETECTED.  The SARS-CoV-2 RNA is generally detectable in upper respiratory specimens during the acute phase of infection. The lowest concentration of SARS-CoV-2 viral copies this assay can detect is 138 copies/mL. A negative result does not preclude SARS-Cov-2 infection and should not be used as the sole basis for treatment or other patient management decisions. A negative result may occur with  improper specimen collection/handling, submission of specimen other than nasopharyngeal swab, presence of viral mutation(s) within the areas targeted by this assay, and inadequate number of viral copies(<138 copies/mL). A negative result must be combined with clinical observations, patient history, and epidemiological information. The expected result is Negative.  Fact Sheet for Patients:  EntrepreneurPulse.com.au  Fact Sheet for Healthcare Providers:  IncredibleEmployment.be  This test is no t yet approved or cleared by the [place] FDA and  has been authorized for detection and/or diagnosis of SARS-CoV-2 by FDA under an Emergency Use Authorization (EUA). This EUA will remain  in effect (meaning this test can be used) for the duration of the COVID-19 declaration under Section 564(b)(1) of the Act, 21 U.S.C.section 360bbb-3(b)(1), unless the authorization is terminated  or revoked sooner.       Influenza A by PCR NEGATIVE NEGATIVE   Influenza B by PCR NEGATIVE NEGATIVE    Comment: (NOTE) The Xpert Xpress  SARS-CoV-2/FLU/RSV plus assay is intended as an aid in the diagnosis of influenza from Nasopharyngeal swab specimens and should not be used as a sole basis for treatment. Nasal washings and aspirates are unacceptable for Xpert Xpress SARS-CoV-2/FLU/RSV testing.  Fact Sheet for Patients: EntrepreneurPulse.com.au  Fact Sheet for Healthcare Providers: IncredibleEmployment.be  This test is not yet approved or cleared by the [place] FDA and has been authorized for detection and/or diagnosis of SARS-CoV-2 by FDA under an Emergency Use Authorization (EUA). This EUA will remain in effect (meaning this test can be used) for the duration of the COVID-19 declaration under Section 564(b)(1) of the Act, 21 U.S.C. section 360bbb-3(b)(1), unless the authorization is terminated or revoked.  Performed at [facility], [address] [place], [place] [address]   Pregnancy, urine     Status: None   Collection Time: 10/13/22 11:53 AM  Result Value Ref Range   Preg Test, Ur NEGATIVE NEGATIVE    Comment:        THE SENSITIVITY OF THIS METHODOLOGY IS >20 mIU/mL. Performed at [facility], [address] [place], [place] [address]     Medications:  Current  Facility-Administered Medications  Medication Dose Route Frequency Provider Last Rate Last Admin   albuterol (VENTOLIN HFA) 108 (90 Base) MCG/ACT inhaler 2 puff  2 puff Inhalation Q4H PRN Kommor, [place], MD   2 puff at 10/14/22 1104   Current Outpatient Medications  Medication Sig Dispense Refill   acetaminophen (TYLENOL) 325 MG tablet Take 650 mg by mouth every 6 (six) hours as needed for mild pain or headache.     ADVAIR DISKUS 100-50 MCG/ACT AEPB 1 puff 2 (two) times daily.     albuterol (PROVENTIL HFA;VENTOLIN HFA) 108 (90 BASE) MCG/ACT inhaler Inhale 2 puffs into the lungs every 4 (four) hours as needed for wheezing or shortness of breath. 1 Inhaler 1   ARIPiprazole (ABILIFY) 10 MG tablet Take 10 mg  by mouth daily.     atorvastatin (LIPITOR) 40 MG tablet Take 40 mg by mouth daily.     azelastine (ASTELIN) 0.1 % nasal spray Place 1 spray into both nostrils 2 (two) times daily.     clonazePAM (KLONOPIN) 1 MG tablet Take 1 mg by mouth 2 (two) times daily.     ferrous sulfate 325 (65 FE) MG tablet Take 325 mg by mouth every 30 (thirty) days.     FLUoxetine (PROZAC) 10 MG capsule Take 10 mg by mouth daily.     guanFACINE (TENEX) 1 MG tablet Take 1 mg by mouth at bedtime.     hydrOXYzine (VISTARIL) 25 MG capsule Take 25 mg by mouth at bedtime as needed.     progesterone (PROMETRIUM) 100 MG capsule Take 1 daily at bedtime 30 capsule 3   amoxicillin (AMOXIL) 500 MG capsule Take 500 mg by mouth 3 (three) times daily. (Patient not taking: Reported on 10/13/2022)     budesonide-formoterol (SYMBICORT) 80-4.5 MCG/ACT inhaler Inhale 2 puffs into the lungs 2 (two) times daily. (Patient not taking: Reported on 09/24/2022) 1 Inhaler 12   traMADol (ULTRAM) 50 MG tablet Take 50 mg by mouth every 6 (six) hours as needed for pain. (Patient not taking: Reported on 09/24/2022)     Musculoskeletal: Strength & Muscle Tone: within normal limits Gait & Station: normal Patient leans: N/A  Psychiatric Specialty Exam:  Presentation  General Appearance:  Appropriate for Environment; Casual; Fairly Groomed  Eye Contact: Good  Speech: Clear and Coherent; Normal Rate  Speech Volume: Normal  Handedness: Right  Mood and Affect  Mood: Euthymic  Affect: Congruent  Thought Process  Thought Processes: Coherent; Goal Directed  Descriptions of Associations:Intact  Orientation:Full (Time, Place and Person)  Thought Content:Logical; WDL  History of Schizophrenia/Schizoaffective disorder:No  Duration of Psychotic Symptoms:No data recorded Hallucinations:Hallucinations: None  Ideas of Reference:None  Suicidal Thoughts:Suicidal Thoughts: No  Homicidal Thoughts:Homicidal Thoughts: No  Sensorium   Memory: Immediate Good; Remote Good  Judgment: Intact  Insight: Fair  Community education officer  Concentration: Good  Attention Span: Good  Recall: Good  Fund of Knowledge: Fair  Language: Good  Psychomotor Activity  Psychomotor Activity: Psychomotor Activity: Normal  Assets  Assets: Communication Skills; Desire for Improvement; Housing; Physical Health; Resilience; Social Support  Sleep  Sleep: Sleep: Good Number of Hours of Sleep: 6  Physical Exam: Physical Exam Vitals and nursing note reviewed.   Review of Systems  Constitutional: Negative.   HENT: Negative.         Chief complaint with history of migraine headache, resolved.  Eyes: Negative.   Respiratory: Negative.    Cardiovascular: Negative.        Blood pressure 144/104, pulse 79.  Nursing staff to recheck vital signs  Gastrointestinal: Negative.   Genitourinary: Negative.   Musculoskeletal: Negative.   Skin: Negative.   Neurological:  Positive for headaches.       Chief complaint with history of migraine headache, resolved.   Endo/Heme/Allergies: Negative.   Psychiatric/Behavioral:  Positive for substance abuse. The patient is nervous/anxious.    Blood pressure (!) 144/104, pulse 79, temperature 97.7 F (36.5 C), temperature source Oral, resp. rate 18, height '5\' 4"'$  (1.626 m), weight 129.3 kg, last menstrual period 09/18/2022, SpO2 100 %. Body mass index is 48.92 kg/m.  Treatment Plan Summary: Daily contact with patient to assess and evaluate symptoms and progress in treatment and Medication management  Plans: -- Discharged to home, to follow-up with her PCP for resuming home medications. -- Patient to follow-up with her therapist and psychiatrist as scheduled. -- Discussed crisis plan, support from social did work, calling 911 or going to the closest emergency room if experiencing crisis, and calling suicide hotline at 1 [phone].  Disposition: No evidence of imminent risk to self or  others at present.    This service was provided via telemedicine using a 2-way, interactive audio and video technology.  Names of all persons participating in this telemedicine service and their role in this encounter. Name: Duwayne Heck Role: Patient  Name: Marian Sorrow Twin Role: EP provider  Name: Dr. Dwyane Dee Role: Medical Director  Name: Doren Custard Role: The patient ED physician    Laretta Bolster, [place] 10/14/2022 11:52 AM

## 2022-10-14 NOTE — ED Notes (Signed)
Pt. Given breakfast tray

## 2022-10-14 NOTE — ED Provider Notes (Signed)
  Physical Exam  BP (!) 144/104 (BP Location: Right Arm)   Pulse 79   Temp 97.7 F (36.5 C) (Oral)   Resp 18   Ht '5\' 4"'$  (1.626 m)   Wt 129.3 kg   LMP 09/18/2022   SpO2 100%   BMI 48.92 kg/m   Physical Exam  Procedures  Procedures  ED Course / MDM    Medical Decision Making Amount and/or Complexity of Data Reviewed Labs: ordered.  Risk Prescription drug management.   Patient's been seen by psychiatry and cleared for discharge.  Follow-up resources given.       Davonna Belling, MD 10/14/22 1434

## 2022-10-30 ENCOUNTER — Encounter: Payer: Self-pay | Admitting: *Deleted

## 2022-11-11 ENCOUNTER — Ambulatory Visit: Payer: 59 | Admitting: Adult Health

## 2023-01-08 ENCOUNTER — Encounter: Payer: Self-pay | Admitting: Radiology

## 2023-02-06 ENCOUNTER — Other Ambulatory Visit: Payer: Self-pay | Admitting: Adult Health

## 2023-03-11 ENCOUNTER — Other Ambulatory Visit: Payer: Self-pay | Admitting: Adult Health

## 2023-04-03 ENCOUNTER — Encounter (HOSPITAL_COMMUNITY): Payer: Self-pay | Admitting: Emergency Medicine

## 2023-04-03 ENCOUNTER — Emergency Department (HOSPITAL_COMMUNITY): Payer: Medicaid Other

## 2023-04-03 ENCOUNTER — Emergency Department (HOSPITAL_COMMUNITY)
Admission: EM | Admit: 2023-04-03 | Discharge: 2023-04-03 | Disposition: A | Discharge status: Home / Self Care | Payer: No Typology Code available for payment source | Attending: Emergency Medicine | Admitting: Emergency Medicine

## 2023-04-03 ENCOUNTER — Other Ambulatory Visit: Payer: Self-pay

## 2023-04-03 DIAGNOSIS — Y92410 Unspecified street and highway as the place of occurrence of the external cause: Secondary | ICD-10-CM | POA: Diagnosis not present

## 2023-04-03 DIAGNOSIS — R1030 Lower abdominal pain, unspecified: Secondary | ICD-10-CM | POA: Diagnosis present

## 2023-04-03 DIAGNOSIS — S40011A Contusion of right shoulder, initial encounter: Secondary | ICD-10-CM | POA: Insufficient documentation

## 2023-04-03 DIAGNOSIS — T148XXA Other injury of unspecified body region, initial encounter: Secondary | ICD-10-CM

## 2023-04-03 DIAGNOSIS — S301XXA Contusion of abdominal wall, initial encounter: Secondary | ICD-10-CM | POA: Insufficient documentation

## 2023-04-03 DIAGNOSIS — S20211A Contusion of right front wall of thorax, initial encounter: Secondary | ICD-10-CM | POA: Diagnosis not present

## 2023-04-03 DIAGNOSIS — S8002XA Contusion of left knee, initial encounter: Secondary | ICD-10-CM | POA: Insufficient documentation

## 2023-04-03 LAB — CBC WITH DIFFERENTIAL/PLATELET
Abs Immature Granulocytes: 0.09 10*3/uL — ABNORMAL HIGH (ref 0.00–0.07)
Basophils Absolute: 0.1 10*3/uL (ref 0.0–0.1)
Basophils Relative: 0 %
Eosinophils Absolute: 0.2 10*3/uL (ref 0.0–0.5)
Eosinophils Relative: 1 %
HCT: 40.7 % (ref 36.0–46.0)
Hemoglobin: 13.5 g/dL (ref 12.0–15.0)
Immature Granulocytes: 1 %
Lymphocytes Relative: 15 %
Lymphs Abs: 2.3 10*3/uL (ref 0.7–4.0)
MCH: 29.5 pg (ref 26.0–34.0)
MCHC: 33.2 g/dL (ref 30.0–36.0)
MCV: 88.9 fL (ref 80.0–100.0)
Monocytes Absolute: 1.3 10*3/uL — ABNORMAL HIGH (ref 0.1–1.0)
Monocytes Relative: 9 %
Neutro Abs: 10.7 10*3/uL — ABNORMAL HIGH (ref 1.7–7.7)
Neutrophils Relative %: 74 %
Platelets: 303 10*3/uL (ref 150–400)
RBC: 4.58 MIL/uL (ref 3.87–5.11)
RDW: 13.4 % (ref 11.5–15.5)
WBC: 14.6 10*3/uL — ABNORMAL HIGH (ref 4.0–10.5)
nRBC: 0 % (ref 0.0–0.2)

## 2023-04-03 LAB — COMPREHENSIVE METABOLIC PANEL
ALT: 29 U/L (ref 0–44)
AST: 23 U/L (ref 15–41)
Albumin: 3.6 g/dL (ref 3.5–5.0)
Alkaline Phosphatase: 74 U/L (ref 38–126)
Anion gap: 10 (ref 5–15)
BUN: 11 mg/dL (ref 6–20)
CO2: 25 mmol/L (ref 22–32)
Calcium: 9.2 mg/dL (ref 8.9–10.3)
Chloride: 99 mmol/L (ref 98–111)
Creatinine, Ser: 0.59 mg/dL (ref 0.44–1.00)
GFR, Estimated: >60 mL/min (ref >60)
Glucose, Bld: 158 mg/dL — ABNORMAL HIGH (ref 70–99)
Potassium: 4.3 mmol/L (ref 3.5–5.1)
Sodium: 134 mmol/L — ABNORMAL LOW (ref 135–145)
Total Bilirubin: 0.7 mg/dL (ref 0.3–1.2)
Total Protein: 7.4 g/dL (ref 6.5–8.1)

## 2023-04-03 LAB — PREGNANCY, URINE: Preg Test, Ur: NEGATIVE

## 2023-04-03 MED ORDER — HYDROCODONE-ACETAMINOPHEN 5-325 MG PO TABS: 1.0000 | ORAL_TABLET | ORAL | 0 refills | Status: DC | PRN

## 2023-04-03 MED ORDER — IOHEXOL 300 MG/ML  SOLN
100.0000 mL | Freq: Once | INTRAMUSCULAR | Status: AC | PRN
  Administered 2023-04-03: 100 mL via INTRAVENOUS
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
## 2023-04-04 ENCOUNTER — Telehealth (HOSPITAL_COMMUNITY): Payer: Self-pay | Admitting: Physician Assistant

## 2023-04-04 MED ORDER — HYDROCODONE-ACETAMINOPHEN 5-325 MG PO TABS: 1.0000 | ORAL_TABLET | ORAL | 0 refills | Status: DC | PRN

## 2023-04-04 NOTE — Telephone Encounter (Signed)
Pharmacy change
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
## 2023-04-13 ENCOUNTER — Encounter: Payer: Self-pay | Admitting: *Deleted

## 2023-05-13 ENCOUNTER — Other Ambulatory Visit: Payer: Self-pay | Admitting: Adult Health

## 2023-05-27 ENCOUNTER — Encounter: Payer: Self-pay | Admitting: Adult Health

## 2023-05-27 ENCOUNTER — Ambulatory Visit (INDEPENDENT_AMBULATORY_CARE_PROVIDER_SITE_OTHER): Payer: Medicaid Other | Admitting: Adult Health

## 2023-05-27 ENCOUNTER — Other Ambulatory Visit (HOSPITAL_COMMUNITY)
Admission: RE | Admit: 2023-05-27 | Discharge: 2023-05-27 | Disposition: A | Discharge status: Home / Self Care | Payer: Medicaid Other | Source: Ambulatory Visit | Attending: Adult Health | Admitting: Adult Health

## 2023-05-27 VITALS — BP 140/84 | HR 85 | Ht 64.0 in | Wt 304.0 lb

## 2023-05-27 DIAGNOSIS — Z1211 Encounter for screening for malignant neoplasm of colon: Secondary | ICD-10-CM

## 2023-05-27 DIAGNOSIS — K429 Umbilical hernia without obstruction or gangrene: Secondary | ICD-10-CM

## 2023-05-27 DIAGNOSIS — Z113 Encounter for screening for infections with a predominantly sexual mode of transmission: Secondary | ICD-10-CM

## 2023-05-27 DIAGNOSIS — Z1231 Encounter for screening mammogram for malignant neoplasm of breast: Secondary | ICD-10-CM

## 2023-05-27 DIAGNOSIS — N951 Menopausal and female climacteric states: Secondary | ICD-10-CM | POA: Diagnosis not present

## 2023-05-27 DIAGNOSIS — R232 Flushing: Secondary | ICD-10-CM

## 2023-05-27 DIAGNOSIS — Z0000 Encounter for general adult medical examination without abnormal findings: Secondary | ICD-10-CM | POA: Insufficient documentation

## 2023-05-27 DIAGNOSIS — R4589 Other symptoms and signs involving emotional state: Secondary | ICD-10-CM

## 2023-05-27 DIAGNOSIS — N3945 Continuous leakage: Secondary | ICD-10-CM

## 2023-05-27 LAB — HEMOCCULT GUIAC POC 1CARD (OFFICE): Fecal Occult Blood, POC: NEGATIVE

## 2023-05-27 MED ORDER — PROGESTERONE MICRONIZED 100 MG PO CAPS: ORAL_CAPSULE | ORAL | 6 refills | Status: DC
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
## 2023-05-28 LAB — HIV ANTIBODY (ROUTINE TESTING W REFLEX): HIV Screen 4th Generation wRfx: NONREACTIVE

## 2023-05-28 LAB — RPR: RPR Ser Ql: NONREACTIVE

## 2023-05-29 LAB — CYTOLOGY - PAP
Chlamydia: NEGATIVE
Comment: NEGATIVE
Comment: NEGATIVE
Comment: NORMAL
Diagnosis: NEGATIVE
High risk HPV: NEGATIVE
Neisseria Gonorrhea: NEGATIVE

## 2023-06-01 ENCOUNTER — Other Ambulatory Visit: Payer: Self-pay | Admitting: Adult Health

## 2023-06-01 ENCOUNTER — Encounter: Payer: Self-pay | Admitting: Adult Health

## 2023-06-01 DIAGNOSIS — A599 Trichomoniasis, unspecified: Secondary | ICD-10-CM | POA: Insufficient documentation

## 2023-06-01 MED ORDER — METRONIDAZOLE 500 MG PO TABS: 500.0000 mg | ORAL_TABLET | Freq: Two times a day (BID) | ORAL | 0 refills | Status: DC

## 2023-06-02 ENCOUNTER — Encounter: Payer: Self-pay | Admitting: *Deleted

## 2023-06-16 ENCOUNTER — Ambulatory Visit: Payer: Medicaid Other | Admitting: Surgery

## 2023-06-16 ENCOUNTER — Encounter: Payer: Self-pay | Admitting: Surgery

## 2023-06-16 VITALS — BP 116/71 | HR 68 | Temp 97.9°F | Resp 18 | Ht 64.0 in | Wt 303.0 lb

## 2023-06-16 DIAGNOSIS — K429 Umbilical hernia without obstruction or gangrene: Secondary | ICD-10-CM | POA: Diagnosis not present

## 2023-06-16 NOTE — Progress Notes (Signed)
[name] History and Physical  Reason for Referral: Umbilical hernia Referring Physician: Dr. Earney Cole is a 52 y.o. female.  HPI: Patient presents for evaluation of an umbilical hernia.  She first noted this hernia when she was pregnant 7 years ago.  She was scheduled to undergo repair of the hernia at the time of her C-section, but she then developed significant preeclampsia, and the hernia was not repaired at the time of C-section.  She does intermittently have discomfort associated with the hernia, which has become more prevalent recently.  She is able to partially reduce the hernia normally.  She is tolerating a diet without nausea and vomiting and moving her bowels.  Her last bowel movement was yesterday evening.  She has a past medical history significant for depression, bipolar disorder, schizophrenia, hyperlipidemia, joint pain, and restless leg syndrome.  She denies use of blood thinning medications.  Her surgical history is significant for 2 C-sections.  She denies use of tobacco products, alcohol, and illicit drugs.  Past Medical History:  Diagnosis Date  . Asthma   . Bipolar 1 disorder (HCC)   . Depression   . Diabetes mellitus without complication (HCC)   . Hydrocephalus (HCC)   . Migraine   . Paranoid schizophrenia (HCC)   . Personality disorder (HCC)   . Psychotic disorder (HCC)   . PTSD (post-traumatic stress disorder)   . Schwannoma     Past Surgical History:  Procedure Laterality Date  . CESAREAN SECTION     x2  . TUBAL LIGATION      Family History  Problem Relation Age of Onset  . Heart disease Maternal Grandmother   . Cancer Maternal Grandmother   . Hyperlipidemia Maternal Grandmother   . COPD Maternal Grandmother   . Hyperlipidemia Maternal Grandfather   . Heart disease Mother     Social History   Tobacco Use  . Smoking status: Never  . Smokeless tobacco: Never  Vaping Use  . Vaping status: Never Used  Substance  Use Topics  . Alcohol use: No  . Drug use: Not Currently    Types: Cocaine, Marijuana    Comment: crack - last used 3 nights ago; last used THC last PM    Medications: I have reviewed the patient's current medications. Allergies as of 06/16/2023   No Known Allergies      Medication List        Accurate as of June 16, 2023  1:09 PM. If you have any questions, ask your nurse or doctor.          acetaminophen 325 MG tablet Commonly known as: TYLENOL Take 650 mg by mouth every 6 (six) hours as needed for mild pain or headache.   albuterol 108 (90 Base) MCG/ACT inhaler Commonly known as: VENTOLIN HFA Inhale 2 puffs into the lungs every 4 (four) hours as needed for wheezing or shortness of breath.   ARIPiprazole 10 MG tablet Commonly known as: ABILIFY Take 10 mg by mouth daily.   atorvastatin 40 MG tablet Commonly known as: LIPITOR Take 40 mg by mouth daily.   ferrous sulfate 325 (65 FE) MG tablet Take 325 mg by mouth every 30 (thirty) days.   FLUoxetine 20 MG capsule Commonly known as: PROZAC 1 capsule Orally Once a day for 30 days   gabapentin 400 MG capsule Commonly known as: NEURONTIN Take 400 mg by mouth 3 (three) times daily.   guanFACINE 1 MG tablet Commonly known as: TENEX Take 1  mg by mouth at bedtime.   hydrOXYzine 25 MG capsule Commonly known as: VISTARIL Take 25 mg by mouth at bedtime as needed.   metroNIDAZOLE 500 MG tablet Commonly known as: FLAGYL Take 1 tablet (500 mg total) by mouth 2 (two) times daily.   progesterone 100 MG capsule Commonly known as: PROMETRIUM TAKE ONE CAPSULE BY MOUTH AT BEDTIME         ROS:  Constitutional: {Findings; ROS constitutional:30497::"negative"} Eyes: {Findings; ROS eyes:30500::"negative"} Ears, nose, mouth, throat, and face: {Findings; ROS HEENT:30502::"negative"} Respiratory: {Findings; ROS respiratory:30504::"negative"} Cardiovascular: {Findings; ROS cardiac:30506::"negative"} Gastrointestinal:  {Findings; ROS gastrointestinal:30513::"negative"} Genitourinary:{Findings; ROS genitourinary:30516::"negative"} Integument/breast: {Findings; ROS skin/breast:30520::"negative"} Hematologic/lymphatic: {Findings; ROS hematologic/lymphatic:30522::"negative"} Musculoskeletal:{Findings; ROS musculoskeletal:30524::"negative"} Neurological: {Findings; ROS neuro:30532::"negative"} Endocrine: {Findings; ROS endocrine:30536::"negative"}  Last menstrual period 05/13/2023. Physical Exam  Results: CT chest, abdomen, and pelvis (04/03/23): IMPRESSION: 1. Moderate volume fat containing umbilical hernia with abdominal wall defect of 1.7 cm. Associated fat stranding of the fat contained within the hernia at the level of the seatbelt sign. Correlate with serial abdominal exams as an underlying developing mesenteric hematoma within the umbilical hernia is not excluded. Differential diagnosis includes incarceration of the hernia which is less likely in this scenario. 2. Otherwise no acute intrathoracic, intra-abdominal, intrapelvic traumatic injury. 3. No acute fracture or traumatic malalignment of the thoracic or lumbar spine. 4.  Aortic Atherosclerosis (ICD10-I70.0).   Assessment & Plan:  Sherry Cole is a 52 y.o. female who presents for evaluation of umbilical hernia.  -I discussed the pathophysiology of *** and we discussed the need for surgical repair -The risk and benefits of robotic assisted laparoscopic umbilical henria repair with mesh*** were discussed including but not limited to bleeding, infection, injury to surrounding structures, need for additional procedures, and hernia recurrence.  After careful consideration, Sherry Cole has decided to ***.  -Patient tentatively scheduled for surgery on *** -Information provided to the patient regarding *** -Advised that the patient should present to the ED if she begins to have painful, nonreducible bulge, nausea, vomiting, and  obstipation  All questions were answered to the satisfaction of the patient.   [name], DO [name] [address] [place], [place] [address] [phone] (office)

## 2023-06-17 NOTE — H&P (Signed)
[name] History and Physical  Reason for Referral: Umbilical hernia Referring Physician: [name] is a 52 y.o. female.  HPI: Patient presents for evaluation of an umbilical hernia.  She first noted this hernia when she was pregnant 7 years ago.  She was scheduled to undergo repair of the hernia at the time of her C-section, but she then developed significant preeclampsia, and the hernia was not repaired at the time of C-section.  She does intermittently have discomfort associated with the hernia, which has become more prevalent recently.  She is able to partially reduce the hernia normally.  She is tolerating a diet without nausea and vomiting and moving her bowels.  Her last bowel movement was yesterday evening.  She has a past medical history significant for depression, bipolar disorder, schizophrenia, hyperlipidemia, joint pain, and restless leg syndrome.  She denies use of blood thinning medications.  Her surgical history is significant for 2 C-sections.  She denies use of tobacco products, alcohol, and illicit drugs.  Past Medical History:  Diagnosis Date   Asthma    Bipolar 1 disorder (HCC)    Depression    Diabetes mellitus without complication (HCC)    Hydrocephalus (HCC)    Migraine    Paranoid schizophrenia (HCC)    Personality disorder (HCC)    Psychotic disorder (HCC)    PTSD (post-traumatic stress disorder)    Schwannoma     Past Surgical History:  Procedure Laterality Date   CESAREAN SECTION     x2   TUBAL LIGATION      Family History  Problem Relation Age of Onset   Heart disease Maternal Grandmother    Cancer Maternal Grandmother    Hyperlipidemia Maternal Grandmother    COPD Maternal Grandmother    Hyperlipidemia Maternal Grandfather    Heart disease Mother     Social History   Tobacco Use   Smoking status: Never   Smokeless tobacco: Never  Vaping Use   Vaping status: Never Used  Substance Use Topics   Alcohol  use: No   Drug use: Not Currently    Types: Cocaine, Marijuana    Comment: crack - last used 3 nights ago; last used THC last PM    Medications: I have reviewed the patient's current medications. Allergies as of 06/16/2023   No Known Allergies      Medication List        Accurate as of June 16, 2023  1:09 PM. If you have any questions, ask your nurse or doctor.          acetaminophen 325 MG tablet Commonly known as: TYLENOL Take 650 mg by mouth every 6 (six) hours as needed for mild pain or headache.   albuterol 108 (90 Base) MCG/ACT inhaler Commonly known as: VENTOLIN HFA Inhale 2 puffs into the lungs every 4 (four) hours as needed for wheezing or shortness of breath.   ARIPiprazole 10 MG tablet Commonly known as: ABILIFY Take 10 mg by mouth daily.   atorvastatin 40 MG tablet Commonly known as: LIPITOR Take 40 mg by mouth daily.   ferrous sulfate 325 (65 FE) MG tablet Take 325 mg by mouth every 30 (thirty) days.   FLUoxetine 20 MG capsule Commonly known as: PROZAC 1 capsule Orally Once a day for 30 days   gabapentin 400 MG capsule Commonly known as: NEURONTIN Take 400 mg by mouth 3 (three) times daily.   guanFACINE 1 MG tablet Commonly known as: TENEX Take 1  mg by mouth at bedtime.   hydrOXYzine 25 MG capsule Commonly known as: VISTARIL Take 25 mg by mouth at bedtime as needed.   metroNIDAZOLE 500 MG tablet Commonly known as: FLAGYL Take 1 tablet (500 mg total) by mouth 2 (two) times daily.   progesterone 100 MG capsule Commonly known as: PROMETRIUM TAKE ONE CAPSULE BY MOUTH AT BEDTIME         ROS:  Constitutional: negative for chills, fatigue, and fevers Eyes: negative for visual disturbance and pain Ears, nose, mouth, throat, and face: negative for ear drainage, sore throat, and sinus problems Respiratory: positive for cough, wheezing, and shortness of breath Cardiovascular: negative for chest pain and palpitations Gastrointestinal:  negative for abdominal pain, nausea, reflux symptoms, and vomiting Genitourinary:negative for dysuria and frequency Integument/breast: negative for dryness and rash Hematologic/lymphatic: negative for bleeding and lymphadenopathy Musculoskeletal:positive for joint pain, negative for back pain and neck pain Neurological: negative for dizziness and tremors Endocrine: negative for temperature intolerance  Last menstrual period 05/13/2023. Physical Exam Vitals reviewed.  Constitutional:      Appearance: Normal appearance.  HENT:     Head: Normocephalic and atraumatic.  Eyes:     Extraocular Movements: Extraocular movements intact.     Pupils: Pupils are equal, round, and reactive to light.  Cardiovascular:     Rate and Rhythm: Normal rate and regular rhythm.  Pulmonary:     Effort: Pulmonary effort is normal.     Breath sounds: Normal breath sounds.  Abdominal:     Comments: Abdomen soft, nondistended, no percussion tenderness, nontender to palpation; no rigidity, guarding, or rebound tenderness; partially reducible umbilical hernia, mild TTP  Musculoskeletal:        General: Normal range of motion.     Cervical back: Normal range of motion.  Skin:    General: Skin is warm and dry.  Neurological:     General: No focal deficit present.     Mental Status: She is alert and oriented to person, place, and time.  Psychiatric:        Mood and Affect: Mood normal.        Behavior: Behavior normal.     Results: CT chest, abdomen, and pelvis (04/03/23): IMPRESSION: 1. Moderate volume fat containing umbilical hernia with abdominal wall defect of 1.7 cm. Associated fat stranding of the fat contained within the hernia at the level of the seatbelt sign. Correlate with serial abdominal exams as an underlying developing mesenteric hematoma within the umbilical hernia is not excluded. Differential diagnosis includes incarceration of the hernia which is less likely in this scenario. 2.  Otherwise no acute intrathoracic, intra-abdominal, intrapelvic traumatic injury. 3. No acute fracture or traumatic malalignment of the thoracic or lumbar spine. 4.  Aortic Atherosclerosis (ICD10-I70.0).   Assessment & Plan:  Deneshia Hice is a 52 y.o. female who presents for evaluation of umbilical hernia.  -I discussed the pathophysiology of hernias and we discussed the need for surgical repair -The risk and benefits of robotic assisted laparoscopic umbilical henria repair with mesh were discussed including but not limited to bleeding, infection, injury to surrounding structures, need for additional procedures, and hernia recurrence.  After careful consideration, Oliver Maidonado has decided to proceed with surgery.  -Patient tentatively scheduled for surgery on 8/26 -Information provided to the patient regarding umbilical hernias -Advised that the patient should present to the ED if she begins to have painful, nonreducible bulge, nausea, vomiting, and obstipation  All questions were answered to the satisfaction of the patient.  [name], DO [facility] [address] [place] [place], [place] [address] [phone] (office)

## 2023-06-24 ENCOUNTER — Ambulatory Visit: Payer: Medicaid Other | Admitting: Urology

## 2023-07-01 NOTE — Patient Instructions (Signed)
Sherry Cole  07/01/2023     @PREFPERIOPPHARMACY @   Your procedure is scheduled on  07/06/2023.   Report to [facility] at  0750  A.M.   Call this number if you have problems the morning of surgery:  [phone]  If you experience any cold or flu symptoms such as cough, fever, chills, shortness of breath, etc. between now and your scheduled surgery, please notify us at the above number.   Remember:  Do not eat or drink after midnight.      Take these medicines the morning of surgery with A SIP OF WATER                          abilify, gabapentin, hydroxyzine.     Do not wear jewelry, make-up or nail polish, including gel polish,  artificial nails, or any other type of covering on natural nails (fingers and  toes).  Do not wear lotions, powders, or perfumes, or deodorant.  Do not shave 48 hours prior to surgery.  Men may shave face and neck.  Do not bring valuables to the hospital.  [facility] is not responsible for any belongings or valuables.  Contacts, dentures or bridgework may not be worn into surgery.  Leave your suitcase in the car.  After surgery it may be brought to your room.  For patients admitted to the hospital, discharge time will be determined by your treatment team.  Patients discharged the day of surgery will not be allowed to drive home or be alone for 24 hours.    Special instructions:   DO NOT smoke tobacco or vape for 24 hours before your procedure.  Please read over the following fact sheets that you were given. Pain Booklet, Coughing and Deep Breathing, Anesthesia Post-op Instructions, and Care and Recovery After Surgery      Laparoscopic Ventral Hernia Repair, Care After The following information offers guidance on how to care for yourself after your procedure. Your health care provider may also give you more specific instructions. If you have problems or questions, contact your health care provider. What can I expect after  the procedure? After the procedure, it is common to have pain, discomfort, or soreness. Follow these instructions at home: Medicines Take over-the-counter and prescription medicines only as told by your health care provider. Ask your health care provider if the medicine prescribed to you: Requires you to avoid driving or using machinery. Can cause constipation. You may need to take these actions to prevent or treat constipation: Drink enough fluid to keep your urine pale yellow. Take over-the-counter or prescription medicines. Eat foods that are high in fiber, such as beans, whole grains, and fresh fruits and vegetables. Limit foods that are high in fat and processed sugars, such as fried or sweet foods. Incision care  Follow instructions from your health care provider about how to take care of your incisions. Make sure you: Wash your hands with soap and water for at least 20 seconds before and after you change your bandage (dressing) or before you touch your abdomen. If soap and water are not available, use hand sanitizer. Change your dressing as told by your health care provider. Leave stitches (sutures), skin glue, or adhesive strips in place. These skin closures may need to stay in place for 2 weeks or longer. If adhesive strip edges start to loosen and curl up, you may trim the loose edges. Do  not remove adhesive strips completely unless your health care provider tells you to do that. Check your incision areas every day for signs of infection. Check for: More redness, swelling, or pain. Fluid or blood. Warmth. Pus or a bad smell. Bathing  Do not take baths, swim, or use a hot tub until your health care provider approves. Ask your health care provider if you may take showers. You may only be allowed to take sponge baths. Keep your dressing dry until your health care provider says it can be removed. Activity  Rest as told by your health care provider. Avoid sitting for a long time  without moving. Get up to take short walks every 1-2 hours. This is important to improve blood flow and breathing. Ask for help if you feel weak or unsteady. Do not lift anything that is heavier than 10 lb (4.5 kg), or the limit that you are told, until your health care provider says that it is safe. If you were given a sedative during the procedure, it can affect you for several hours. Do not drive or operate machinery until your health care provider says that it is safe. Return to your normal activities as told by your health care provider. Ask your health care provider what activities are safe for you. General instructions  Hold a pillow over your abdomen when you cough or sneeze. This helps with pain. Do not use any products that contain nicotine or tobacco. These products include cigarettes, chewing tobacco, and vaping devices, such as e-cigarettes. These can delay healing after surgery. If you need help quitting, ask your health care provider. You may be asked to continue to do deep breathing exercises at home. This will help to prevent a lung infection. Keep all follow-up visits. This is important. Contact a health care provider if: You have any of these signs of infection: More redness, swelling, or pain around an incision. Fluid or blood coming from an incision. Warmth coming from an incision. Pus or a bad smell coming from an incision. A fever or chills. You have pain that gets worse or does not get better with medicine. You have nausea or vomiting. You have a cough. You have shortness of breath. You have not had a bowel movement in 3 days. You are not able to urinate. Get help right away if you have: Severe pain in your abdomen. Persistent nausea and vomiting. Redness, warmth, or pain in your leg. Chest pain. Trouble breathing. These symptoms may represent a serious problem that is an emergency. Do not wait to see if the symptoms will go away. Get medical help right away. Call  your local emergency services (911 in the [place] Do not drive yourself to the hospital. Summary After this procedure, it is common to have pain, discomfort, or soreness. Follow instructions from your health care provider about how to take care of your incision. Check your incision area every day for signs of infection. Report any signs of infection to your health care provider. Keep all follow-up visits. This is important. This information is not intended to replace advice given to you by your health care provider. Make sure you discuss any questions you have with your health care provider. Document Revised: 06/15/2020 Document Reviewed: 06/15/2020 Elsevier Patient Education  2024 [employer] General Anesthesia, Adult, Care After The following information offers guidance on how to care for yourself after your procedure. Your health care provider may also give you more specific instructions. If you have problems or questions,  contact your health care provider. What can I expect after the procedure? After the procedure, it is common for people to: Have pain or discomfort at the IV site. Have nausea or vomiting. Have a sore throat or hoarseness. Have trouble concentrating. Feel cold or chills. Feel weak, sleepy, or tired (fatigue). Have soreness and body aches. These can affect parts of the body that were not involved in surgery. Follow these instructions at home: For the time period you were told by your health care provider:  Rest. Do not participate in activities where you could fall or become injured. Do not drive or use machinery. Do not drink alcohol. Do not take sleeping pills or medicines that cause drowsiness. Do not make important decisions or sign legal documents. Do not take care of children on your own. General instructions Drink enough fluid to keep your urine pale yellow. If you have sleep apnea, surgery and certain medicines can increase your risk for breathing  problems. Follow instructions from your health care provider about wearing your sleep device: Anytime you are sleeping, including during daytime naps. While taking prescription pain medicines, sleeping medicines, or medicines that make you drowsy. Return to your normal activities as told by your health care provider. Ask your health care provider what activities are safe for you. Take over-the-counter and prescription medicines only as told by your health care provider. Do not use any products that contain nicotine or tobacco. These products include cigarettes, chewing tobacco, and vaping devices, such as e-cigarettes. These can delay incision healing after surgery. If you need help quitting, ask your health care provider. Contact a health care provider if: You have nausea or vomiting that does not get better with medicine. You vomit every time you eat or drink. You have pain that does not get better with medicine. You cannot urinate or have bloody urine. You develop a skin rash. You have a fever. Get help right away if: You have trouble breathing. You have chest pain. You vomit blood. These symptoms may be an emergency. Get help right away. Call 911. Do not wait to see if the symptoms will go away. Do not drive yourself to the hospital. Summary After the procedure, it is common to have a sore throat, hoarseness, nausea, vomiting, or to feel weak, sleepy, or fatigue. For the time period you were told by your health care provider, do not drive or use machinery. Get help right away if you have difficulty breathing, have chest pain, or vomit blood. These symptoms may be an emergency. This information is not intended to replace advice given to you by your health care provider. Make sure you discuss any questions you have with your health care provider. Document Revised: 01/24/2022 Document Reviewed: 01/24/2022 Elsevier Patient Education  2024 [employer] How to Use Chlorhexidine Before  Surgery Chlorhexidine gluconate (CHG) is a germ-killing (antiseptic) solution that is used to clean the skin. It can get rid of the bacteria that normally live on the skin and can keep them away for about 24 hours. To clean your skin with CHG, you may be given: A CHG solution to use in the shower or as part of a sponge bath. A prepackaged cloth that contains CHG. Cleaning your skin with CHG may help lower the risk for infection: While you are staying in the intensive care unit of the hospital. If you have a vascular access, such as a central line, to provide short-term or long-term access to your veins. If you have a catheter  to drain urine from your bladder. If you are on a ventilator. A ventilator is a machine that helps you breathe by moving air in and out of your lungs. After surgery. What are the risks? Risks of using CHG include: A skin reaction. Hearing loss, if CHG gets in your ears and you have a perforated eardrum. Eye injury, if CHG gets in your eyes and is not rinsed out. The CHG product catching fire. Make sure that you avoid smoking and flames after applying CHG to your skin. Do not use CHG: If you have a chlorhexidine allergy or have previously reacted to chlorhexidine. On babies younger than 58 months of age. How to use CHG solution Use CHG only as told by your health care provider, and follow the instructions on the label. Use the full amount of CHG as directed. Usually, this is one bottle. During a shower Follow these steps when using CHG solution during a shower (unless your health care provider gives you different instructions): Start the shower. Use your normal soap and shampoo to wash your face and hair. Turn off the shower or move out of the shower stream. Pour the CHG onto a clean washcloth. Do not use any type of brush or rough-edged sponge. Starting at your neck, lather your body down to your toes. Make sure you follow these instructions: If you will be having  surgery, pay special attention to the part of your body where you will be having surgery. Scrub this area for at least 1 minute. Do not use CHG on your head or face. If the solution gets into your ears or eyes, rinse them well with water. Avoid your genital area. Avoid any areas of skin that have broken skin, cuts, or scrapes. Scrub your back and under your arms. Make sure to wash skin folds. Let the lather sit on your skin for 1-2 minutes or as long as told by your health care provider. Thoroughly rinse your entire body in the shower. Make sure that all body creases and crevices are rinsed well. Dry off with a clean towel. Do not put any substances on your body afterward--such as powder, lotion, or perfume--unless you are told to do so by your health care provider. Only use lotions that are recommended by the manufacturer. Put on clean clothes or pajamas. If it is the night before your surgery, sleep in clean sheets.  During a sponge bath Follow these steps when using CHG solution during a sponge bath (unless your health care provider gives you different instructions): Use your normal soap and shampoo to wash your face and hair. Pour the CHG onto a clean washcloth. Starting at your neck, lather your body down to your toes. Make sure you follow these instructions: If you will be having surgery, pay special attention to the part of your body where you will be having surgery. Scrub this area for at least 1 minute. Do not use CHG on your head or face. If the solution gets into your ears or eyes, rinse them well with water. Avoid your genital area. Avoid any areas of skin that have broken skin, cuts, or scrapes. Scrub your back and under your arms. Make sure to wash skin folds. Let the lather sit on your skin for 1-2 minutes or as long as told by your health care provider. Using a different clean, wet washcloth, thoroughly rinse your entire body. Make sure that all body creases and crevices are  rinsed well. Dry off with a clean  towel. Do not put any substances on your body afterward--such as powder, lotion, or perfume--unless you are told to do so by your health care provider. Only use lotions that are recommended by the manufacturer. Put on clean clothes or pajamas. If it is the night before your surgery, sleep in clean sheets. How to use CHG prepackaged cloths Only use CHG cloths as told by your health care provider, and follow the instructions on the label. Use the CHG cloth on clean, dry skin. Do not use the CHG cloth on your head or face unless your health care provider tells you to. When washing with the CHG cloth: Avoid your genital area. Avoid any areas of skin that have broken skin, cuts, or scrapes. Before surgery Follow these steps when using a CHG cloth to clean before surgery (unless your health care provider gives you different instructions): Using the CHG cloth, vigorously scrub the part of your body where you will be having surgery. Scrub using a back-and-forth motion for 3 minutes. The area on your body should be completely wet with CHG when you are done scrubbing. Do not rinse. Discard the cloth and let the area air-dry. Do not put any substances on the area afterward, such as powder, lotion, or perfume. Put on clean clothes or pajamas. If it is the night before your surgery, sleep in clean sheets.  For general bathing Follow these steps when using CHG cloths for general bathing (unless your health care provider gives you different instructions). Use a separate CHG cloth for each area of your body. Make sure you wash between any folds of skin and between your fingers and toes. Wash your body in the following order, switching to a new cloth after each step: The front of your neck, shoulders, and chest. Both of your arms, under your arms, and your hands. Your stomach and groin area, avoiding the genitals. Your right leg and foot. Your left leg and foot. The back of  your neck, your back, and your buttocks. Do not rinse. Discard the cloth and let the area air-dry. Do not put any substances on your body afterward--such as powder, lotion, or perfume--unless you are told to do so by your health care provider. Only use lotions that are recommended by the manufacturer. Put on clean clothes or pajamas. Contact a health care provider if: Your skin gets irritated after scrubbing. You have questions about using your solution or cloth. You swallow any chlorhexidine. Call your local poison control center ([phone] in the [place] Get help right away if: Your eyes itch badly, or they become very red or swollen. Your skin itches badly and is red or swollen. Your hearing changes. You have trouble seeing. You have swelling or tingling in your mouth or throat. You have trouble breathing. These symptoms may represent a serious problem that is an emergency. Do not wait to see if the symptoms will go away. Get medical help right away. Call your local emergency services (911 in the [place] Do not drive yourself to the hospital. Summary Chlorhexidine gluconate (CHG) is a germ-killing (antiseptic) solution that is used to clean the skin. Cleaning your skin with CHG may help to lower your risk for infection. You may be given CHG to use for bathing. It may be in a bottle or in a prepackaged cloth to use on your skin. Carefully follow your health care provider's instructions and the instructions on the product label. Do not use CHG if you have a chlorhexidine allergy. Contact your  health care provider if your skin gets irritated after scrubbing. This information is not intended to replace advice given to you by your health care provider. Make sure you discuss any questions you have with your health care provider. Document Revised: 02/24/2022 Document Reviewed: 01/07/2021 Elsevier Patient Education  2023 [employer].

## 2023-07-02 ENCOUNTER — Other Ambulatory Visit: Payer: Self-pay

## 2023-07-02 ENCOUNTER — Encounter (HOSPITAL_COMMUNITY)
Admission: RE | Admit: 2023-07-02 | Discharge: 2023-07-02 | Disposition: A | Discharge status: Home / Self Care | Payer: Medicaid Other | Source: Ambulatory Visit | Attending: Surgery | Admitting: Surgery

## 2023-07-02 ENCOUNTER — Encounter (HOSPITAL_COMMUNITY): Payer: Self-pay

## 2023-07-02 VITALS — BP 116/71 | HR 68 | Temp 97.9°F | Resp 18 | Ht 64.0 in | Wt 303.0 lb

## 2023-07-02 DIAGNOSIS — F1410 Cocaine abuse, uncomplicated: Secondary | ICD-10-CM | POA: Diagnosis not present

## 2023-07-02 DIAGNOSIS — Z01818 Encounter for other preprocedural examination: Secondary | ICD-10-CM

## 2023-07-02 DIAGNOSIS — E119 Type 2 diabetes mellitus without complications: Secondary | ICD-10-CM | POA: Diagnosis not present

## 2023-07-02 DIAGNOSIS — Z01812 Encounter for preprocedural laboratory examination: Secondary | ICD-10-CM | POA: Diagnosis present

## 2023-07-02 HISTORY — DX: Personal history of urinary calculi: Z87.442

## 2023-07-02 LAB — BASIC METABOLIC PANEL
Anion gap: 9 (ref 5–15)
BUN: 14 mg/dL (ref 6–20)
CO2: 23 mmol/L (ref 22–32)
Calcium: 9.3 mg/dL (ref 8.9–10.3)
Chloride: 103 mmol/L (ref 98–111)
Creatinine, Ser: 0.74 mg/dL (ref 0.44–1.00)
GFR, Estimated: >60 mL/min (ref >60)
Glucose, Bld: 152 mg/dL — ABNORMAL HIGH (ref 70–99)
Potassium: 3.3 mmol/L — ABNORMAL LOW (ref 3.5–5.1)
Sodium: 135 mmol/L (ref 135–145)

## 2023-07-02 LAB — RAPID URINE DRUG SCREEN, HOSP PERFORMED
Amphetamines: NOT DETECTED
Barbiturates: NOT DETECTED
Benzodiazepines: NOT DETECTED
Cocaine: POSITIVE — AB
Opiates: NOT DETECTED
Tetrahydrocannabinol: NOT DETECTED

## 2023-07-02 LAB — POCT PREGNANCY, URINE: Preg Test, Ur: NEGATIVE

## 2023-07-02 LAB — HEMOGLOBIN A1C
Hgb A1c MFr Bld: 7.3 % — ABNORMAL HIGH (ref 4.8–5.6)
Mean Plasma Glucose: 162.81 mg/dL

## 2023-07-02 NOTE — Pre-Procedure Instructions (Signed)
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

## 2023-07-08 ENCOUNTER — Other Ambulatory Visit: Payer: Self-pay | Admitting: Adult Health

## 2023-07-20 ENCOUNTER — Encounter (HOSPITAL_COMMUNITY): Payer: No Typology Code available for payment source

## 2023-07-20 NOTE — Progress Notes (Unsigned)
Name: Sherry Cole DOB: Sep 23, 1971 MRN: [id number]  History of Present Illness: Sherry Cole is a 52 y.o. female who presents today as a new patient at [facility] [place]. All available relevant medical records have been reviewed.  - GU history: 1. Kidney stones.  Today: She reports chief complaint of mixed urinary incontinence; referred by GYN provider.  She reports urinary frequency, nocturia, urgency, and urge incontinence. Voiding 6x/day and 3-4x/night on average. Leaking 3x/day on average; denies using pads or diapers but sometimes does have to change her underwear.  She also reports stress urinary incontinence with cough / laugh / sneeze.  She reports that the stress incontinence is predominant.  She denies prior attempted treatment for these symptoms.  She reports caffeine intake (soda - reports 1 caffeinated beverages per day on average).  She reports suspected obstructive sleep apnea but states she hasn't been formally evaluated for that year; denies ever having a sleep study before. She reports fluid intake within 3 hours prior to bedtime. She reports fluid intake during the night. She denies caffeine intake within 8 hours prior to bedtime. She denies routinely experiencing lower extremity edema during the day.  She denies dysuria, gross hematuria, straining to void, or sensations of incomplete emptying.  Reports last kidney stone episode was >10 years ago. Today she denies any acute flank or abdominal pain.    Fall Screening: Do you usually have a device to assist in your mobility? No   Medications: Current Outpatient Medications  Medication Sig Dispense Refill   solifenacin (VESICARE) 10 MG tablet Take 1 tablet (10 mg total) by mouth daily. 30 tablet 5   acetaminophen (TYLENOL) 500 MG tablet Take 500-1,000 mg by mouth every 6 (six) hours as needed (pain.).     albuterol (PROVENTIL HFA;VENTOLIN HFA) 108 (90 BASE) MCG/ACT inhaler Inhale 2 puffs  into the lungs every 4 (four) hours as needed for wheezing or shortness of breath. 1 Inhaler 1   ARIPiprazole (ABILIFY) 10 MG tablet Take 10 mg by mouth in the morning.     atorvastatin (LIPITOR) 40 MG tablet Take 40 mg by mouth at bedtime.     B Complex-C (B-COMPLEX WITH VITAMIN C) tablet Take 1 tablet by mouth daily.     carboxymethylcellul-glycerin ([employer] LUBRICATING/DRY EYE) 0.5-0.9 % ophthalmic solution Place 1-2 drops into both eyes 3 (three) times daily as needed for dry eyes.     Cholecalciferol (VITAMIN D3 PO) Take 1 tablet by mouth daily.     diphenhydrAMINE (BENADRYL) 25 MG tablet Take 25 mg by mouth every 6 (six) hours as needed (animal allergies.).     ferrous sulfate 325 (65 FE) MG tablet Take 325 mg by mouth daily.     FLUoxetine (PROZAC) 20 MG capsule Take 20 mg by mouth at bedtime.     gabapentin (NEURONTIN) 400 MG capsule Take 400 mg by mouth 3 (three) times daily.     guanFACINE (TENEX) 1 MG tablet Take 1 mg by mouth at bedtime.     hydrOXYzine (VISTARIL) 25 MG capsule Take 25 mg by mouth every 8 (eight) hours as needed for anxiety.     Multiple Vitamin (MULTIVITAMIN WITH MINERALS) TABS tablet Take 1 tablet by mouth daily.     Probiotic Product (PROBIOTIC PO) Take 1 tablet by mouth daily. Gummy     progesterone (PROMETRIUM) 100 MG capsule TAKE ONE CAPSULE BY MOUTH AT BEDTIME 30 capsule 6   traZODone (DESYREL) 100 MG tablet Take 100 mg by mouth at bedtime  as needed for sleep.     No current facility-administered medications for this visit.    Allergies: No Known Allergies  Past Medical History:  Diagnosis Date   Asthma    Bipolar 1 disorder (HCC)    Depression    Diabetes mellitus without complication (HCC)    History of kidney stones    Hydrocephalus (HCC)    Migraine    Paranoid schizophrenia (HCC)    Personality disorder (HCC)    Psychotic disorder (HCC)    PTSD (post-traumatic stress disorder)    Schwannoma    Past Surgical History:  Procedure Laterality  Date   CESAREAN SECTION     x2   TUBAL LIGATION     Family History  Problem Relation Age of Onset   Heart disease Maternal Grandmother    Cancer Maternal Grandmother    Hyperlipidemia Maternal Grandmother    COPD Maternal Grandmother    Hyperlipidemia Maternal Grandfather    Heart disease Mother    Social History   Socioeconomic History   Marital status: Legally Separated    Spouse name: Not on file   Number of children: Not on file   Years of education: Not on file   Highest education level: Not on file  Occupational History   Not on file  Tobacco Use   Smoking status: Never   Smokeless tobacco: Never  Vaping Use   Vaping status: Never Used  Substance and Sexual Activity   Alcohol use: No   Drug use: Not Currently    Types: Cocaine, Marijuana    Comment: pt denies   Sexual activity: Yes    Partners: Male    Birth control/protection: Surgical    Comment: tubal  Other Topics Concern   Not on file  Social History Narrative   Not on file   Social Determinants of Health   Financial Resource Strain: Medium Risk (05/27/2023)   Overall Financial Resource Strain (CARDIA)    Difficulty of Paying Living Expenses: Somewhat hard  Food Insecurity: No Food Insecurity (05/27/2023)   Hunger Vital Sign    Worried About Running Out of Food in the Last Year: Never true    Ran Out of Food in the Last Year: Never true  Transportation Needs: Unmet Transportation Needs (05/27/2023)   PRAPARE - Transportation    Lack of Transportation (Medical): Yes    Lack of Transportation (Non-Medical): Yes  Physical Activity: Inactive (05/27/2023)   Exercise Vital Sign    Days of Exercise per Week: 0 days    Minutes of Exercise per Session: 0 min  Stress: Stress Concern Present (05/27/2023)   [employer] of Occupational Health - Occupational Stress Questionnaire    Feeling of Stress : Very much  Social Connections: Moderately Isolated (05/27/2023)   Social Connection and Isolation Panel  [NHANES]    Frequency of Communication with Friends and Family: More than three times a week    Frequency of Social Gatherings with Friends and Family: Three times a week    Attends Religious Services: Never    Active Member of Clubs or Organizations: Yes    Attends Banker Meetings: Never    Marital Status: Separated  Intimate Partner Violence: Not At Risk (05/27/2023)   Humiliation, Afraid, Rape, and Kick questionnaire    Fear of Current or Ex-Partner: No    Emotionally Abused: No    Physically Abused: No    Sexually Abused: No    SUBJECTIVE  Review of Systems Constitutional: Patient denies any  unintentional weight loss or change in strength lntegumentary: Patient denies any rashes or pruritus Eyes: Patient denies dry eyes ENT: Patient denies dry mouth Cardiovascular: Patient denies chest pain or syncope Respiratory: Patient denies shortness of breath Gastrointestinal: Patient denies nausea, vomiting, constipation, or diarrhea Musculoskeletal: Patient denies muscle cramps or weakness Neurologic: Patient denies convulsions or seizures Psychiatric: Patient denies memory problems Allergic/Immunologic: Patient denies recent allergic reaction(s) Hematologic/Lymphatic: Patient denies bleeding tendencies Endocrine: Patient denies heat/cold intolerance  GU: As per HPI.  OBJECTIVE Vitals:   07/21/23 1019  BP: 127/69  Pulse: 74  Temp: 97.9 F (36.6 C)   There is no height or weight on file to calculate BMI.  Physical Examination Constitutional: No obvious distress; patient is non-toxic appearing  Cardiovascular: No visible lower extremity edema.  Respiratory: The patient does not have audible wheezing/stridor; respirations do not appear labored  Gastrointestinal: Abdomen non-distended Musculoskeletal: Normal ROM of UEs  Skin: No obvious rashes/open sores  Neurologic: CN 2-12 grossly intact Psychiatric: Answered questions appropriately with normal affect   Hematologic/Lymphatic/Immunologic: No obvious bruises or sites of spontaneous bleeding  UA: negative  PVR: 0 ml  ASSESSMENT Mixed stress and urge urinary incontinence - Plan: Urinalysis, Routine w reflex microscopic, BLADDER SCAN AMB NON-IMAGING, solifenacin (VESICARE) 10 MG tablet, Ambulatory referral to Physical Therapy  Urinary incontinence with continuous leakage - Plan: Urinalysis, Routine w reflex microscopic, BLADDER SCAN AMB NON-IMAGING, solifenacin (VESICARE) 10 MG tablet, Ambulatory referral to Physical Therapy  History of kidney stones  OAB (overactive bladder) - Plan: solifenacin (VESICARE) 10 MG tablet  Urinary frequency - Plan: solifenacin (VESICARE) 10 MG tablet  Urinary urgency - Plan: solifenacin (VESICARE) 10 MG tablet  Nocturia - Plan: solifenacin (VESICARE) 10 MG tablet  We discussed the different forms of urinary incontinence, such as stress and urge incontinence, and described how symptoms are consistent with mixed urinary incontinence.  1. For treatment of stress urinary incontinence: We discussed expectant management versus nonsurgical options versus surgery. Nonsurgical options include weight loss, Kegel exercises, with or without physical therapy, as well as an incontinence pessary. Surgical options include a midurethral sling, a Burch urethropexy, and transurethral injection of a bulking agent. She would like to proceed with pelvic floor physical therapy.  2. For treatment of OAB with urinary frequency, nocturia, urgency, and urge incontinence:  We discussed the symptoms of overactive bladder (OAB), which include urinary urgency, frequency, nocturia, with or without urge incontinence.   While we may not know the exact etiology of OAB, several risk factors can be identified.  - Likely exacerbated by caffeine intake.   We discussed the following management options in detail including potential benefits, risks, and side effects: Behavioral therapy: Modify  fluid intake Decreasing bladder irritants (such as caffeine, acidic foods, spicy foods, alcohol) Urge suppression strategies Bladder retraining / timed voiding Double voiding Medication(s): - For anticholinergic medications, we discussed the potential side effects of anticholinergics including dry eyes, dry mouth, constipation, cognitive impairment and urinary retention.  - For beta-3 agonist medication, we discussed the risk for urinary retention and the potential side effect of elevated blood pressure specific to Myrbetriq (which is more likely to occur in individuals with uncontrolled hypertension).  For refractory cases: PTNS (posterior tibial nerve stimulation) Sacral neuromodulation trial (Medtronic lnterStim or Axonics implant) Bladder Botox injections  She decided to trial Vesicare 10 mg daily and will work on behavioral modifications including minimizing caffeine intake and working on timed voiding.  Will plan for follow up in 8 weeks or sooner if  needed. Pt verbalized understanding and agreement. All questions were answered.  PLAN Advised the following: 1. Start Vesicare 10 mg daily.  2. Minimize caffeine intake. 3. Work on timed voiding. 4. Referred to pelvic floor physical therapy. 5. Return in about 8 weeks (around 09/15/2023) for UA, PVR, & f/u with Evette Georges NP.  Orders Placed This Encounter  Procedures   Urinalysis, Routine w reflex microscopic   Ambulatory referral to Physical Therapy    Referral Priority:   Routine    Referral Type:   Physical Medicine    Referral Reason:   Specialty Services Required    Requested Specialty:   Physical Therapy    Number of Visits Requested:   1   BLADDER SCAN AMB NON-IMAGING    It has been explained that the patient is to follow regularly with their PCP in addition to all other providers involved in their care and to follow instructions provided by these respective offices. Patient advised to contact urology clinic if any  urologic-pertaining questions, concerns, new symptoms or problems arise in the interim period.  Patient Instructions              Overactive bladder (OAB) overview for patients:  Symptoms may include: urinary urgency ("gotta go" feeling) urinary frequency (voiding >8 times per day) night time urination (nocturia) urge incontinence of urine (UUI)  While we do not know the exact etiology of OAB, several treatment options exist including:  Behavioral therapy: Reducing fluid intake Decreasing bladder stimulants (such as caffeine) and irritants (such as acidic food, spicy foods, alcohol) Urge suppression strategies Bladder retraining via timed voiding  Pelvic floor physical therapy  Medication(s) - can use one or both of the drug classes below. Anticholinergic / antimuscarinic medications:  Mechanism of action: Activate M3 receptors to reduce detrusor stimulation and increase bladder capacity   (parasympathetic nervous system). Effect: Relaxes the bladder to decrease overactivity, increase bladder storage capacity, and increase time between voids. Onset: Slow acting (may take 8-12 weeks to determine efficacy). Medications include: Vesicare (Solifenacin), Ditropan (Oxybutynin), Detrol (Tolterodine), Toviaz (Fesoterodine), Sanctura (Trospium), Urispas (Flavoxate), Enablex (Darifenacin), Bentyl (Dicyclomine), Levsin (Hyoscyamine ). Potential side effects include but are not limited to: Dry eyes, dry mouth, constipation, cognitive impairment, dementia risk with long term use, and urinary retention/ incomplete bladder emptying. Insurance companies generally prefer for patients to try 1-2 anticholinergic / antimuscarinic medications first due to low cost. Some exceptions are made based on patient-specific comorbidities / risk factors. Beta-3 agonist medications: Mechanism of action: Stimulates selective B3 adrenergic receptors to cause smooth muscle bladder relaxation (sympathetic  nervous system). Effect: Relaxes the bladder to decrease overactivity, increase bladder storage capacity, and increase time between voids. Onset: Slow acting (may take 8-12 weeks to determine efficacy). Medications include: Myrbetriq (Mirabegron) and Vibegron Leslye Peer). Potential side effects include but are not limited to: urinary retention / incomplete bladder emptying and elevated blood pressure (more likely to occur in individuals with pre-existing uncontrolled hypertension). These medications tend to be more expensive than the anticholinergic / antimuscarinic medications.   For patients with refractory OAB (if the above treatment options have been unsuccessful): Posterior tibial nerve stimulation (PTNS). Small acupuncture-type needle inserted near ankle with electric current to stimulate bladder via posterior tibial nerve pathway. Initially requires 12 weekly in-office treatments lasting 30 minutes each; followed by monthly in-office treatments lasting 30 minutes each for 1 year.  Bladder Botox injections. How it is done: Typically done via in-office cystoscopy; sometimes done in the OR depending on the situation.  The bladder is numbed with lidocaine instilled via a catheter. Then the urologist injects Botox into the bladder muscle wall in about 20 locations. Causes local paralysis of the bladder muscle at the injection sites to reduce bladder muscle overactivity / spasms. The effect lasts for approximately 6 months and cannot be reversed once performed. Risks may included but are not limited to: infection, incomplete bladder emptying/ urinary retention, short term need for self-catheterization or indwelling catheter, and need for repeat therapy. There is a 5-12% chance of needing to catheterize with Botox - that usually resolves in a few months as the Botox wears off. Typically Botox injections would need to be repeated every 3-12 months since this is not a permanent therapy.  Sacral  neuromodulation trial (Medtronic lnterStim or Axonics implant). Sacral neuromodulation is FDA-approved for uncontrolled urinary urgency, urinary frequency, urinary urge incontinence, non-obstructive urinary retention, or fecal incontinence. It is not FDA-approved as a treatment for pain. The goal of this therapy is at least a 50% improvement in symptoms. It is NOT realistic to expect a 100% cure. This is a a 2-step outpatient procedure. After a successful test period, a permanent wire and generator are placed in the OR. We discussed the risk of infection. We reviewed the fact that about 30% of patients fail the test phase and are not candidates for permanent generator placement. During the 1-2 week trial phase, symptoms are documented by the patient to determine response. If patient gets at least a 50% improvement in symptoms, they may then proceed with Step 2. Step 1: Trial lead placement. Per physician discretion, may done one of two ways: Percutaneous nerve evaluation (PNE) in the [facility] urology office. Performed by urologist under local anesthesia (numbing the area with lidocaine) using a spinal needle for placement of test wire, which usually stays in place for 5-7 days to determine therapy response. Test lead placement in OR under anesthesia. Usually stays in place 2 weeks to determine therapy response. > Step 2: Permanent implantation of sacral neuromodulation device, which is performed in the OR.  Sacral neuromodulation implants: All are conditionally MRI safe. Manufacturer: Medtronic Website: [employer] therapy/right-for-you.html Options: lnterStim X: Non-rechargeable. The battery lasts 10 years on average. lnterStim Micro: Rechargeable. The battery lasts 15 years on average and must be charged routinely. Approximately 50% smaller implant than lnterStim X implant.  Manufacturer: Axonics Website:  Findrealrelief[employer]axonics[employer]com Options: Non-rechargeable (Axonics F15): The battery lasts 15 years on average. Rechargeable (Axonics R20): The battery lasts 20 years on average and must be charged in office for about 1 hour every 6-10 months on average. Approximately 50% smaller implant than Axonics non-rechargeable implant.  Note: Generally the rechargeable devices are only advised for very small or thin patients who may not have sufficient adipose tissue to comfortably overlay the implanted device.    Electronically signed by:  Donnita Falls, MSN, FNP-C, CUNP 07/21/2023 11:58 AM

## 2023-07-21 ENCOUNTER — Encounter: Payer: Self-pay | Admitting: Urology

## 2023-07-21 ENCOUNTER — Ambulatory Visit: Payer: Medicaid Other | Admitting: Urology

## 2023-07-21 VITALS — BP 127/69 | HR 74 | Temp 97.9°F

## 2023-07-21 DIAGNOSIS — R350 Frequency of micturition: Secondary | ICD-10-CM

## 2023-07-21 DIAGNOSIS — N3281 Overactive bladder: Secondary | ICD-10-CM | POA: Diagnosis not present

## 2023-07-21 DIAGNOSIS — N3946 Mixed incontinence: Secondary | ICD-10-CM | POA: Diagnosis not present

## 2023-07-21 DIAGNOSIS — R351 Nocturia: Secondary | ICD-10-CM

## 2023-07-21 DIAGNOSIS — R3915 Urgency of urination: Secondary | ICD-10-CM

## 2023-07-21 DIAGNOSIS — N3945 Continuous leakage: Secondary | ICD-10-CM

## 2023-07-21 DIAGNOSIS — Z87442 Personal history of urinary calculi: Secondary | ICD-10-CM | POA: Diagnosis not present

## 2023-07-21 LAB — URINALYSIS, ROUTINE W REFLEX MICROSCOPIC
Bilirubin, UA: NEGATIVE
Glucose, UA: NEGATIVE
Ketones, UA: NEGATIVE
Leukocytes,UA: NEGATIVE
Nitrite, UA: NEGATIVE
RBC, UA: NEGATIVE
Specific Gravity, UA: 1.03 (ref 1.005–1.030)
Urobilinogen, Ur: 1 mg/dL (ref 0.2–1.0)
pH, UA: 6 (ref 5.0–7.5)

## 2023-07-21 MED ORDER — SOLIFENACIN SUCCINATE 10 MG PO TABS
10.0000 mg | ORAL_TABLET | Freq: Every day | ORAL | 5 refills | Status: DC
Start: 2023-07-21 — End: 2023-12-15

## 2023-07-21 NOTE — Progress Notes (Signed)
post void residual=0 ?

## 2023-07-21 NOTE — Patient Instructions (Signed)
Overactive bladder (OAB) overview for patients:  Symptoms may include: urinary urgency ("gotta go" feeling) urinary frequency (voiding >8 times per day) night time urination (nocturia) urge incontinence of urine (UUI)  While we do not know the exact etiology of OAB, several treatment options exist including:  Behavioral therapy: Reducing fluid intake Decreasing bladder stimulants (such as caffeine) and irritants (such as acidic food, spicy foods, alcohol) Urge suppression strategies Bladder retraining via timed voiding  Pelvic floor physical therapy  Medication(s) - can use one or both of the drug classes below. Anticholinergic / antimuscarinic medications:  Mechanism of action: Activate M3 receptors to reduce detrusor stimulation and increase bladder capacity  (parasympathetic nervous system). Effect: Relaxes the bladder to decrease overactivity, increase bladder storage capacity, and increase time between voids. Onset: Slow acting (may take 8-12 weeks to determine efficacy). Medications include: Vesicare (Solifenacin), Ditropan (Oxybutynin), Detrol (Tolterodine), Toviaz (Fesoterodine), Sanctura (Trospium), Urispas (Flavoxate), Enablex (Darifenacin), Bentyl (Dicyclomine), Levsin (Hyoscyamine ). Potential side effects include but are not limited to: Dry eyes, dry mouth, constipation, cognitive impairment, dementia risk with long term use, and urinary retention/ incomplete bladder emptying. Insurance companies generally prefer for patients to try 1-2 anticholinergic / antimuscarinic medications first due to low cost. Some exceptions are made based on patient-specific comorbidities / risk factors. Beta-3 agonist medications: Mechanism of action: Stimulates selective B3 adrenergic receptors to cause smooth muscle bladder relaxation (sympathetic nervous system). Effect: Relaxes the bladder to decrease overactivity, increase bladder storage capacity, and increase time between voids. Onset:  Slow acting (may take 8-12 weeks to determine efficacy). Medications include: Myrbetriq (Mirabegron) and Vibegron (Gemtesa). Potential side effects include but are not limited to: urinary retention / incomplete bladder emptying and elevated blood pressure (more likely to occur in individuals with pre-existing uncontrolled hypertension). These medications tend to be more expensive than the anticholinergic / antimuscarinic medications.   For patients with refractory OAB (if the above treatment options have been unsuccessful): Posterior tibial nerve stimulation (PTNS). Small acupuncture-type needle inserted near ankle with electric current to stimulate bladder via posterior tibial nerve pathway. Initially requires 12 weekly in-office treatments lasting 30 minutes each; followed by monthly in-office treatments lasting 30 minutes each for 1 year.  Bladder Botox injections. How it is done: Typically done via in-office cystoscopy; sometimes done in the OR depending on the situation. The bladder is numbed with lidocaine instilled via a catheter. Then the urologist injects Botox into the bladder muscle wall in about 20 locations. Causes local paralysis of the bladder muscle at the injection sites to reduce bladder muscle overactivity / spasms. The effect lasts for approximately 6 months and cannot be reversed once performed. Risks may included but are not limited to: infection, incomplete bladder emptying/ urinary retention, short term need for self-catheterization or indwelling catheter, and need for repeat therapy. There is a 5-12% chance of needing to catheterize with Botox - that usually resolves in a few months as the Botox wears off. Typically Botox injections would need to be repeated every 3-12 months since this is not a permanent therapy.  Sacral neuromodulation trial (Medtronic lnterStim or Axonics implant). Sacral neuromodulation is FDA-approved for uncontrolled urinary urgency, urinary frequency,  urinary urge incontinence, non-obstructive urinary retention, or fecal incontinence. It is not FDA-approved as a treatment for pain. The goal of this therapy is at least a 50% improvement in symptoms. It is NOT realistic to expect a 100% cure. This is a a 2-step outpatient procedure. After a successful test period, a permanent wire and generator are   placed in the OR. We discussed the risk of infection. We reviewed the fact that about 30% of patients fail the test phase and are not candidates for permanent generator placement. During the 1-2 week trial phase, symptoms are documented by the patient to determine response. If patient gets at least a 50% improvement in symptoms, they may then proceed with Step 2. Step 1: Trial lead placement. Per physician discretion, may done one of two ways: Percutaneous nerve evaluation (PNE) in the [facility] office. Performed by urologist under local anesthesia (numbing the area with lidocaine) using a spinal needle for placement of test wire, which usually stays in place for 5-7 days to determine therapy response. Test lead placement in OR under anesthesia. Usually stays in place 2 weeks to determine therapy response. > Step 2: Permanent implantation of sacral neuromodulation device, which is performed in the OR.  Sacral neuromodulation implants: All are conditionally MRI safe. Manufacturer: Medtronic Website: www.medtronic.com/[place]-en/patients/treatments-therapies/neurostimulator-overactive-bladder/getting therapy/right-for-you.html Options: lnterStim X: Non-rechargeable. The battery lasts 10 years on average. lnterStim Micro: Rechargeable. The battery lasts 15 years on average and must be charged routinely. Approximately 50% smaller implant than lnterStim X implant.  Manufacturer: Axonics Website: Findrealrelief.axonics.com Options: Non-rechargeable (Axonics F15): The battery lasts 15 years on average. Rechargeable (Axonics R20): The battery lasts 20 years on  average and must be charged in office for about 1 hour every 6-10 months on average. Approximately 50% smaller implant than Axonics non-rechargeable implant.  Note: Generally the rechargeable devices are only advised for very small or thin patients who may not have sufficient adipose tissue to comfortably overlay the implanted device.   

## 2023-07-22 ENCOUNTER — Ambulatory Visit (HOSPITAL_COMMUNITY): Admission: RE | Admit: 2023-07-22 | Payer: Medicaid Other | Source: Home / Self Care | Admitting: Surgery

## 2023-07-22 ENCOUNTER — Encounter (HOSPITAL_COMMUNITY): Admission: RE | Payer: Self-pay | Source: Home / Self Care

## 2023-07-22 SURGERY — XI ROBOT ASSISTED UMBILICAL HERNIA REPAIR
Anesthesia: General

## 2023-09-10 DIAGNOSIS — N3281 Overactive bladder: Secondary | ICD-10-CM | POA: Insufficient documentation

## 2023-09-10 DIAGNOSIS — N3946 Mixed incontinence: Secondary | ICD-10-CM | POA: Insufficient documentation

## 2023-09-10 NOTE — Progress Notes (Signed)
Name: Sherry Cole DOB: 09/09/1971 MRN: [id number]  History of Present Illness: Sherry Cole is a 52 y.o. female who presents today for follow up visit at [facility]. - GU history: 1. OAB with urinary frequency, nocturia, urgency. 2. Mixed urinary incontinence (stress predominant). 3. Kidney stones.  At initial visit on 07/21/2023: The plan was: 1. For OAB with UUI: - Start Vesicare 10 mg daily.  - Minimize caffeine intake. - Work on timed voiding. 2. For SUI: - Referred to pelvic floor physical therapy. 3. Re: kidney stones: Asymptomatic. 4. Return in about 8 weeks (around 09/15/2023) for UA, PVR, & f/u with Evette Georges NP.  Today: She reports complete resolution of her OAB and mixed urinary incontinence symptoms since last visit due to her efforts with timed voiding / bladder retraining and decreasing her caffeine intake. She denies ever starting Vesicare (Solifenacin) 10 mg daily or going to pelvic floor physical therapy since her symptoms have responded so well. She denies bothersome urinary urgency, frequency, urge incontinence, or stress incontinence. Her nocturia has improved from >5x/night to only 1x/night.   She denies any recent stone symptoms such as gross hematuria, dysuria, flank pain, or abdominal pain.   Fall Screening: Do you usually have a device to assist in your mobility? No   Medications: Current Outpatient Medications  Medication Sig Dispense Refill   acetaminophen (TYLENOL) 500 MG tablet Take 500-1,000 mg by mouth every 6 (six) hours as needed (pain.).     albuterol (PROVENTIL HFA;VENTOLIN HFA) 108 (90 BASE) MCG/ACT inhaler Inhale 2 puffs into the lungs every 4 (four) hours as needed for wheezing or shortness of breath. 1 Inhaler 1   atorvastatin (LIPITOR) 40 MG tablet Take 40 mg by mouth at bedtime.     B Complex-C (B-COMPLEX WITH VITAMIN C) tablet Take 1 tablet by mouth daily.     carboxymethylcellul-glycerin ([facility]  LUBRICATING/DRY EYE) 0.5-0.9 % ophthalmic solution Place 1-2 drops into both eyes 3 (three) times daily as needed for dry eyes.     diphenhydrAMINE (BENADRYL) 25 MG tablet Take 25 mg by mouth every 6 (six) hours as needed (animal allergies.).     ferrous sulfate 325 (65 FE) MG tablet Take 325 mg by mouth daily.     FLUoxetine (PROZAC) 20 MG capsule Take 20 mg by mouth at bedtime.     gabapentin (NEURONTIN) 400 MG capsule Take 400 mg by mouth 3 (three) times daily.     guanFACINE (TENEX) 1 MG tablet Take 1 mg by mouth at bedtime.     hydrOXYzine (VISTARIL) 25 MG capsule Take 25 mg by mouth every 8 (eight) hours as needed for anxiety.     Multiple Vitamin (MULTIVITAMIN WITH MINERALS) TABS tablet Take 1 tablet by mouth daily.     Probiotic Product (PROBIOTIC PO) Take 1 tablet by mouth daily. Gummy     progesterone (PROMETRIUM) 100 MG capsule TAKE ONE CAPSULE BY MOUTH AT BEDTIME 30 capsule 6   solifenacin (VESICARE) 10 MG tablet Take 1 tablet (10 mg total) by mouth daily. 30 tablet 5   traZODone (DESYREL) 100 MG tablet Take 100 mg by mouth at bedtime as needed for sleep.     ARIPiprazole (ABILIFY) 10 MG tablet Take 10 mg by mouth in the morning. (Patient not taking: Reported on 09/16/2023)     Cholecalciferol (VITAMIN D3 PO) Take 1 tablet by mouth daily. (Patient not taking: Reported on 09/16/2023)     No current facility-administered medications for this visit.  Allergies: No Known Allergies  Past Medical History:  Diagnosis Date   Asthma    Bipolar 1 disorder (HCC)    Depression    Diabetes mellitus without complication (HCC)    History of kidney stones    Hydrocephalus (HCC)    Migraine    Paranoid schizophrenia (HCC)    Personality disorder (HCC)    Psychotic disorder (HCC)    PTSD (post-traumatic stress disorder)    Schwannoma    Past Surgical History:  Procedure Laterality Date   CESAREAN SECTION     x2   TUBAL LIGATION     Family History  Problem Relation Age of Onset    Heart disease Maternal Grandmother    Cancer Maternal Grandmother    Hyperlipidemia Maternal Grandmother    COPD Maternal Grandmother    Hyperlipidemia Maternal Grandfather    Heart disease Mother    Social History   Socioeconomic History   Marital status: Legally Separated    Spouse name: Not on file   Number of children: Not on file   Years of education: Not on file   Highest education level: Not on file  Occupational History   Not on file  Tobacco Use   Smoking status: Never   Smokeless tobacco: Never  Vaping Use   Vaping status: Never Used  Substance and Sexual Activity   Alcohol use: No   Drug use: Not Currently    Types: Cocaine, Marijuana    Comment: pt denies   Sexual activity: Yes    Partners: Male    Birth control/protection: Surgical    Comment: tubal  Other Topics Concern   Not on file  Social History Narrative   Not on file   Social Determinants of Health   Financial Resource Strain: Medium Risk (05/27/2023)   Overall Financial Resource Strain (CARDIA)    Difficulty of Paying Living Expenses: Somewhat hard  Food Insecurity: No Food Insecurity (05/27/2023)   Hunger Vital Sign    Worried About Running Out of Food in the Last Year: Never true    Ran Out of Food in the Last Year: Never true  Transportation Needs: Unmet Transportation Needs (05/27/2023)   PRAPARE - Transportation    Lack of Transportation (Medical): Yes    Lack of Transportation (Non-Medical): Yes  Physical Activity: Inactive (05/27/2023)   Exercise Vital Sign    Days of Exercise per Week: 0 days    Minutes of Exercise per Session: 0 min  Stress: Stress Concern Present (05/27/2023)   [employer] of Occupational Health - Occupational Stress Questionnaire    Feeling of Stress : Very much  Social Connections: Moderately Isolated (05/27/2023)   Social Connection and Isolation Panel [NHANES]    Frequency of Communication with Friends and Family: More than three times a week    Frequency  of Social Gatherings with Friends and Family: Three times a week    Attends Religious Services: Never    Active Member of Clubs or Organizations: Yes    Attends Banker Meetings: Never    Marital Status: Separated  Intimate Partner Violence: Not At Risk (05/27/2023)   Humiliation, Afraid, Rape, and Kick questionnaire    Fear of Current or Ex-Partner: No    Emotionally Abused: No    Physically Abused: No    Sexually Abused: No    Review of Systems Constitutional: Patient denies any unintentional weight loss or change in strength lntegumentary: Patient denies any rashes or pruritus Cardiovascular: Patient denies chest pain or  syncope Respiratory: Patient denies shortness of breath Gastrointestinal: Patient denies nausea, vomiting, constipation, or diarrhea Musculoskeletal: Patient denies muscle cramps or weakness Neurologic: Patient denies convulsions or seizures Allergic/Immunologic: Patient denies recent allergic reaction(s) Hematologic/Lymphatic: Patient denies bleeding tendencies Endocrine: Patient denies heat/cold intolerance  GU: As per HPI.  OBJECTIVE Vitals:   09/16/23 1151  BP: 137/83  Pulse: 82  Temp: 98.5 F (36.9 C)   There is no height or weight on file to calculate BMI.  Physical Examination Constitutional: No obvious distress; patient is non-toxic appearing  Cardiovascular: No visible lower extremity edema.  Respiratory: The patient does not have audible wheezing/stridor; respirations do not appear labored  Gastrointestinal: Abdomen non-distended Musculoskeletal: Normal ROM of UEs  Skin: No obvious rashes/open sores  Neurologic: CN 2-12 grossly intact Psychiatric: Answered questions appropriately with normal affect  Hematologic/Lymphatic/Immunologic: No obvious bruises or sites of spontaneous bleeding  UA: no evidence of UTI or microscopic hematuria PVR: patient refused   ASSESSMENT OAB (overactive bladder) - Plan: Urinalysis, Routine w  reflex microscopic, CANCELED: BLADDER SCAN AMB NON-IMAGING  Mixed stress and urge urinary incontinence  She is doing great and has no urologic concerns at this time; she elects to continue management with behavioral modifications including minimizing caffeine intake and timed voiding / bladder retraining. Discussed option for follow up in 6-12 months for surveillance; she elected to follow up on an as-needed basis. All questions were answered.  PLAN Advised the following: 1. Continue management with behavioral modifications including minimizing caffeine intake and timed voiding / bladder retraining. 2. Return if symptoms worsen or fail to improve.  Orders Placed This Encounter  Procedures   Urinalysis, Routine w reflex microscopic    It has been explained that the patient is to follow regularly with their PCP in addition to all other providers involved in their care and to follow instructions provided by these respective offices. Patient advised to contact urology clinic if any urologic-pertaining questions, concerns, new symptoms or problems arise in the interim period.  There are no Patient Instructions on file for this visit.  Electronically signed by:  Donnita Falls, FNP   09/16/23    12:15 PM

## 2023-09-16 ENCOUNTER — Ambulatory Visit: Payer: Medicaid Other | Admitting: Urology

## 2023-09-16 ENCOUNTER — Encounter: Payer: Self-pay | Admitting: Urology

## 2023-09-16 VITALS — BP 137/83 | HR 82 | Temp 98.5°F

## 2023-09-16 DIAGNOSIS — N3281 Overactive bladder: Secondary | ICD-10-CM

## 2023-09-16 DIAGNOSIS — N3946 Mixed incontinence: Secondary | ICD-10-CM | POA: Diagnosis not present

## 2023-09-16 LAB — URINALYSIS, ROUTINE W REFLEX MICROSCOPIC
Bilirubin, UA: NEGATIVE
Glucose, UA: NEGATIVE
Ketones, UA: NEGATIVE
Leukocytes,UA: NEGATIVE
Nitrite, UA: NEGATIVE
Protein,UA: NEGATIVE
RBC, UA: NEGATIVE
Specific Gravity, UA: 1.025 (ref 1.005–1.030)
Urobilinogen, Ur: 2 mg/dL — ABNORMAL HIGH (ref 0.2–1.0)
pH, UA: 6 (ref 5.0–7.5)

## 2023-10-05 ENCOUNTER — Ambulatory Visit (HOSPITAL_COMMUNITY)
Admission: RE | Admit: 2023-10-05 | Discharge: 2023-10-05 | Disposition: A | Discharge status: Home / Self Care | Payer: Medicaid Other | Source: Ambulatory Visit | Attending: Adult Health | Admitting: Adult Health

## 2023-10-05 DIAGNOSIS — Z1231 Encounter for screening mammogram for malignant neoplasm of breast: Secondary | ICD-10-CM | POA: Insufficient documentation

## 2023-11-27 ENCOUNTER — Ambulatory Visit: Payer: Medicaid Other | Admitting: Adult Health

## 2023-11-27 ENCOUNTER — Encounter: Payer: Self-pay | Admitting: Adult Health

## 2023-11-27 VITALS — BP 119/73 | HR 79 | Ht 64.0 in | Wt 299.0 lb

## 2023-11-27 DIAGNOSIS — R232 Flushing: Secondary | ICD-10-CM | POA: Diagnosis not present

## 2023-11-27 DIAGNOSIS — R4589 Other symptoms and signs involving emotional state: Secondary | ICD-10-CM

## 2023-11-27 DIAGNOSIS — N3945 Continuous leakage: Secondary | ICD-10-CM

## 2023-11-27 DIAGNOSIS — N951 Menopausal and female climacteric states: Secondary | ICD-10-CM | POA: Diagnosis not present

## 2023-11-27 MED ORDER — PROGESTERONE MICRONIZED 100 MG PO CAPS: ORAL_CAPSULE | ORAL | 6 refills | Status: DC

## 2023-11-27 NOTE — Progress Notes (Signed)
  Subjective:     Patient ID: Sherry Cole, female   DOB: 1971/09/17, 53 y.o.   MRN: [id number]  HPI [name] is a 53 year old white female, separated, G2P2001, back in follow up on taking Prometrium, and moods are better, occasional hot flash. She did see urology and had pelvic floor PT and that helped with bladder leakage.     Component Value Date/Time   DIAGPAP  05/27/2023 0945    - Negative for intraepithelial lesion or malignancy (NILM)   HPVHIGH Negative 05/27/2023 0945   ADEQPAP  05/27/2023 0945    Satisfactory for evaluation; transformation zone component PRESENT.    PCP is Dr Durene Cal   Review of Systems Moods better Occasional hot flash Period was good in December Bladder leakage much better after pelvic floor PT Reviewed past medical,surgical, social and family history. Reviewed medications and allergies.     Objective:   Physical Exam BP 119/73 (BP Location: Left Arm, Patient Position: Sitting, Cuff Size: Large)   Pulse 79   Ht 5\' 4"  (1.626 m)   Wt 299 lb (135.6 kg)   LMP 11/06/2023   BMI 51.32 kg/m     Skin warm and dry. Lungs: clear to ausculation bilaterally. Cardiovascular: regular rate and rhythm.   Upstream - 11/27/23 6962       Pregnancy Intention Screening   Does the patient want to become pregnant in the next year? No    Does the patient's partner want to become pregnant in the next year? No    Would the patient like to discuss contraceptive options today? No      Contraception Wrap Up   Current Method Female Sterilization    End Method Female Sterilization    Contraception Counseling Provided No    How was the end contraceptive method provided? N/A             Assessment:     1. Moody (Primary) Moods much better on Prometrium, will refill Meds ordered this encounter  Medications   progesterone (PROMETRIUM) 100 MG capsule    Sig: TAKE ONE CAPSULE BY MOUTH AT BEDTIME    Dispense:  30 capsule    Refill:  6    This prescription was  filled on 04/23/2023. Any refills authorized will be placed on file.    Supervising Provider:   Duane Lope H [[id number]]     2. Perimenopause   3. Hot flashes Has occasional   4. Urinary incontinence with continuous leakage Much better after pelvic floor PT    Plan:     Return in 6 months for physical and ROS

## 2023-12-04 ENCOUNTER — Encounter (INDEPENDENT_AMBULATORY_CARE_PROVIDER_SITE_OTHER): Payer: Self-pay | Admitting: *Deleted

## 2023-12-15 ENCOUNTER — Encounter: Payer: Self-pay | Admitting: Internal Medicine

## 2023-12-15 ENCOUNTER — Ambulatory Visit: Payer: Medicaid Other | Attending: Internal Medicine | Admitting: Internal Medicine

## 2023-12-15 ENCOUNTER — Telehealth: Payer: Self-pay | Admitting: Internal Medicine

## 2023-12-15 VITALS — BP 118/80 | HR 78 | Ht 64.0 in | Wt 294.6 lb

## 2023-12-15 DIAGNOSIS — Z136 Encounter for screening for cardiovascular disorders: Secondary | ICD-10-CM | POA: Diagnosis not present

## 2023-12-15 DIAGNOSIS — R0609 Other forms of dyspnea: Secondary | ICD-10-CM | POA: Diagnosis not present

## 2023-12-15 NOTE — Progress Notes (Signed)
 Cardiology Office Note  Date: 12/15/2023   ID: Sherry Cole, DOB 07/30/1971, MRN [id number]  PCP:  Katrinka Aquas, MD  Cardiologist:  Diannah SHAUNNA Maywood, MD Electrophysiologist:  None   History of Present Illness: Sherry Cole is a 53 y.o. female known to have DM 2 was referred to cardiology clinic for evaluation of dizziness.  Ongoing DOE for the last 1 year.  She is could be from asthma.  No leg swelling.  She also started reporting chest pains at rest and not with physical activity the last 6 months.  Occurring twice per week.  She also reported dizziness but symptom description is similar to vertigo and occurs with sudden head movements and when she wakes up in the morning.  Past Medical History:  Diagnosis Date   Asthma    Bipolar 1 disorder (HCC)    Depression    Diabetes mellitus without complication (HCC)    History of kidney stones    Hydrocephalus (HCC)    Migraine    Paranoid schizophrenia (HCC)    Personality disorder (HCC)    Psychotic disorder (HCC)    PTSD (post-traumatic stress disorder)    Schwannoma     Past Surgical History:  Procedure Laterality Date   CESAREAN SECTION     x2   TUBAL LIGATION      Current Outpatient Medications  Medication Sig Dispense Refill   acetaminophen  (TYLENOL ) 500 MG tablet Take 500-1,000 mg by mouth every 6 (six) hours as needed (pain.).     albuterol  (PROVENTIL  HFA;VENTOLIN  HFA) 108 (90 BASE) MCG/ACT inhaler Inhale 2 puffs into the lungs every 4 (four) hours as needed for wheezing or shortness of breath. 1 Inhaler 1   atorvastatin (LIPITOR) 40 MG tablet Take 40 mg by mouth at bedtime.     B Complex-C (B-COMPLEX WITH VITAMIN C) tablet Take 1 tablet by mouth daily.     Cholecalciferol (VITAMIN D3 PO) Take 1 tablet by mouth daily.     Deutetrabenazine (AUSTEDO) 6 MG TABS 1 tablet with food Orally Twice a day for 30 days     diphenhydrAMINE  (BENADRYL ) 25 MG tablet Take 25 mg by mouth every 6 (six) hours as needed  (animal allergies.).     ferrous sulfate 325 (65 FE) MG tablet Take 325 mg by mouth daily.     FLUoxetine  (PROZAC ) 20 MG capsule Take 20 mg by mouth at bedtime.     gabapentin (NEURONTIN) 400 MG capsule Take 400 mg by mouth 3 (three) times daily.     guanFACINE (TENEX) 1 MG tablet Take 1 mg by mouth at bedtime.     hydrOXYzine  (VISTARIL ) 25 MG capsule Take 25 mg by mouth every 8 (eight) hours as needed for anxiety.     metFORMIN (GLUCOPHAGE-XR) 750 MG 24 hr tablet Take 750 mg by mouth daily with breakfast.     Multiple Vitamin (MULTIVITAMIN WITH MINERALS) TABS tablet Take 1 tablet by mouth daily.     OZEMPIC, 0.25 OR 0.5 MG/DOSE, 2 MG/3ML SOPN 0.25 mg once a week Subcutaneous for 30 days     Probiotic Product (PROBIOTIC PO) Take 1 tablet by mouth daily. Gummy     progesterone  (PROMETRIUM ) 100 MG capsule TAKE ONE CAPSULE BY MOUTH AT BEDTIME 30 capsule 6   traZODone (DESYREL) 100 MG tablet Take 100 mg by mouth at bedtime as needed for sleep.     VRAYLAR 3 MG capsule Take by mouth daily.     No current facility-administered medications for this  visit.   Allergies:  Patient has no known allergies.   Social History: The patient  reports that she has never smoked. She has never used smokeless tobacco. She reports that she does not currently use drugs after having used the following drugs: Cocaine and Marijuana. She reports that she does not drink alcohol.   Family History: The patient's family history includes COPD in her maternal grandmother; Cancer in her maternal grandmother; Heart disease in her maternal grandmother and mother; Hyperlipidemia in her maternal grandfather and maternal grandmother.   ROS:  Please see the history of present illness. Otherwise, complete review of systems is positive for none  All other systems are reviewed and negative.   Physical Exam: VS:  BP 118/80   Pulse 78   Ht 5' 4 (1.626 m)   Wt 294 lb 9.6 oz (133.6 kg)   LMP 11/06/2023   SpO2 95%   BMI 50.57 kg/m ,  BMI Body mass index is 50.57 kg/m.  Wt Readings from Last 3 Encounters:  12/15/23 294 lb 9.6 oz (133.6 kg)  11/27/23 299 lb (135.6 kg)  07/02/23 (!) 303 lb (137.4 kg)    General: Patient appears comfortable at rest. HEENT: Conjunctiva and lids normal, oropharynx clear with moist mucosa. Neck: Supple, no elevated JVP or carotid bruits, no thyromegaly. Lungs: Clear to auscultation, nonlabored breathing at rest. Cardiac: Regular rate and rhythm, no S3 or significant systolic murmur, no pericardial rub. Abdomen: Soft, nontender, no hepatomegaly, bowel sounds present, no guarding or rebound. Extremities: No pitting edema, distal pulses 2+. Skin: Warm and dry. Musculoskeletal: No kyphosis. Neuropsychiatric: Alert and oriented x3, affect grossly appropriate.  Recent Labwork: 04/03/2023: ALT 29; AST 23; Hemoglobin 13.5; Platelets 303 07/02/2023: BUN 14; Creatinine, Ser 0.74; Potassium 3.3; Sodium 135  No results found for: CHOL, TRIG, HDL, CHOLHDL, VLDL, LDLCALC, LDLDIRECT    Assessment and Plan:   DOE: Ongoing DOE for the last 1 year with no evidence of leg swelling.  She does have chest pains mainly with rest and not with exertion.  Obtain echocardiogram and Lexiscan .      Medication Adjustments/Labs and Tests Ordered: Current medicines are reviewed at length with the patient today.  Concerns regarding medicines are outlined above.    Disposition:  Follow up  3 months  Signed Sarit Sparano Priya Rashun Grattan, MD, 12/15/2023 2:13 PM    [facility] [address] [place], [place], [place] [address]

## 2023-12-15 NOTE — Patient Instructions (Signed)
Medication Instructions:  Your physician recommends that you continue on your current medications as directed. Please refer to the Current Medication list given to you today.   Labwork: None  Testing/Procedures: Your physician has requested that you have an echocardiogram. Echocardiography is a painless test that uses sound waves to create images of your heart. It provides your doctor with information about the size and shape of your heart and how well your heart's chambers and valves are working. This procedure takes approximately one hour. There are no restrictions for this procedure. Please do NOT wear cologne, perfume, aftershave, or lotions (deodorant is allowed). Please arrive 15 minutes prior to your appointment time.  Please note: We ask at that you not bring children with you during ultrasound (echo/ vascular) testing. Due to room size and safety concerns, children are not allowed in the ultrasound rooms during exams. Our front office staff cannot provide observation of children in our lobby area while testing is being conducted. An adult accompanying a patient to their appointment will only be allowed in the ultrasound room at the discretion of the ultrasound technician under special circumstances. We apologize for any inconvenience.  Your physician has requested that you have a lexiscan myoview. For further information please visit [email or web address] Please follow instruction sheet, as given.   Follow-Up: Your physician recommends that you schedule a follow-up appointment in: 3 months  Any Other Special Instructions Will Be Listed Below (If Applicable). Thank you for choosing [facility]!      If you need a refill on your cardiac medications before your next appointment, please call your pharmacy.

## 2023-12-15 NOTE — Telephone Encounter (Signed)
Checking percert on the following patient for testing scheduled at [facility].    LEXISCAN-- 12/31/2023

## 2023-12-29 ENCOUNTER — Ambulatory Visit: Payer: Medicaid Other | Attending: Internal Medicine

## 2023-12-29 DIAGNOSIS — R0609 Other forms of dyspnea: Secondary | ICD-10-CM | POA: Diagnosis not present

## 2023-12-29 LAB — ECHOCARDIOGRAM COMPLETE
AR max vel: 2.7 cm2
AV Area VTI: 2.91 cm2
AV Area mean vel: 2.97 cm2
AV Mean grad: 4 mm[Hg]
AV Peak grad: 8.8 mm[Hg]
Ao pk vel: 1.48 m/s
Area-P 1/2: 2.5 cm2
Calc EF: 71.2 %
MV VTI: 2.36 cm2
S' Lateral: 2.4 cm
Single Plane A2C EF: 69.9 %
Single Plane A4C EF: 73.7 %

## 2023-12-29 MED ORDER — PERFLUTREN LIPID MICROSPHERE
1.0000 mL | INTRAVENOUS | Status: AC | PRN
Start: 2023-12-29 — End: 2023-12-29
  Administered 2023-12-29: 5 mL via INTRAVENOUS

## 2023-12-31 ENCOUNTER — Ambulatory Visit (HOSPITAL_COMMUNITY): Payer: Medicaid Other

## 2023-12-31 ENCOUNTER — Encounter (HOSPITAL_COMMUNITY): Payer: Medicaid Other

## 2024-01-01 ENCOUNTER — Telehealth: Payer: Self-pay

## 2024-01-01 MED ORDER — POTASSIUM CHLORIDE CRYS ER 20 MEQ PO TBCR: 20.0000 meq | EXTENDED_RELEASE_TABLET | Freq: Two times a day (BID) | ORAL | 3 refills | Status: AC

## 2024-01-01 MED ORDER — FUROSEMIDE 20 MG PO TABS: 20.0000 mg | ORAL_TABLET | Freq: Every day | ORAL | 3 refills | Status: AC

## 2024-01-01 NOTE — Telephone Encounter (Signed)
 The patient has been notified of the result and verbalized understanding.  All questions (if any) were answered. Roseanne Reno, [facility] 01/01/2024 3:51 PM PCP copied.  Medication sent to [facility] per pt's request.

## 2024-01-01 NOTE — Telephone Encounter (Signed)
-----   Message from Vishnu P Mallipeddi sent at 12/31/2023 10:44 AM EST ----- Normal pumping function of the heart, normal diastology, normal RV function, no evidence of valvular heart disease.  CVP 15 mmHg, volume overloaded based on echocardiogram, start p.o. Lasix 20 mg once daily and start KCl 20 mg twice daily.

## 2024-01-06 ENCOUNTER — Encounter (HOSPITAL_COMMUNITY): Payer: Medicaid Other

## 2024-01-06 ENCOUNTER — Ambulatory Visit (HOSPITAL_COMMUNITY): Admission: RE | Admit: 2024-01-06 | Payer: Medicaid Other | Source: Ambulatory Visit

## 2024-01-13 ENCOUNTER — Ambulatory Visit (HOSPITAL_COMMUNITY)
Admission: RE | Admit: 2024-01-13 | Discharge: 2024-01-13 | Disposition: A | Discharge status: Home / Self Care | Payer: Medicaid Other | Source: Ambulatory Visit | Attending: Internal Medicine | Admitting: Internal Medicine

## 2024-01-13 DIAGNOSIS — R0609 Other forms of dyspnea: Secondary | ICD-10-CM | POA: Diagnosis present

## 2024-01-13 LAB — NM MYOCAR MULTI W/SPECT W/WALL MOTION / EF
Estimated workload: 1
Exercise duration (min): 0 min
Exercise duration (sec): 0 s
LV dias vol: 94 mL (ref 46–106)
LV sys vol: 33 mL
MPHR: 169 {beats}/min
Nuc Stress EF: 65 %
Peak HR: 94 {beats}/min
Percent HR: 55 %
RATE: 0.5
Rest HR: 78 {beats}/min
Rest Nuclear Isotope Dose: 10.8 mCi
SDS: 10
SRS: 1
SSS: 11
ST Depression (mm): 0 mm
Stress Nuclear Isotope Dose: 32.3 mCi
TID: 1.47

## 2024-01-13 MED ORDER — SODIUM CHLORIDE FLUSH 0.9 % IV SOLN
INTRAVENOUS | Status: AC
  Filled 2024-01-13: qty 10

## 2024-01-13 MED ORDER — TECHNETIUM TC 99M TETROFOSMIN IV KIT
10.8000 | PACK | Freq: Once | INTRAVENOUS | Status: AC | PRN
  Administered 2024-01-13: 10.8 via INTRAVENOUS

## 2024-01-13 MED ORDER — TECHNETIUM TC 99M TETROFOSMIN IV KIT
32.3000 | PACK | Freq: Once | INTRAVENOUS | Status: AC | PRN
  Administered 2024-01-13: 32.3 via INTRAVENOUS

## 2024-01-13 MED ORDER — REGADENOSON 0.4 MG/5ML IV SOLN
INTRAVENOUS | Status: AC
  Administered 2024-01-13: 0.4 mg via INTRAVENOUS
  Filled 2024-01-13: qty 5

## 2024-01-15 ENCOUNTER — Telehealth: Payer: Self-pay

## 2024-01-15 DIAGNOSIS — R0609 Other forms of dyspnea: Secondary | ICD-10-CM

## 2024-01-15 DIAGNOSIS — R9439 Abnormal result of other cardiovascular function study: Secondary | ICD-10-CM

## 2024-01-15 DIAGNOSIS — Z136 Encounter for screening for cardiovascular disorders: Secondary | ICD-10-CM

## 2024-01-15 MED ORDER — METOPROLOL TARTRATE 100 MG PO TABS: 100.0000 mg | ORAL_TABLET | Freq: Once | ORAL | 0 refills | Status: DC

## 2024-01-15 NOTE — Telephone Encounter (Signed)
-----   Message from Sherry Cole sent at 01/14/2024  3:09 PM EST ----- Stress test is abnormal, unclear if artifact versus ischemia. Obtain CTA cardiac.

## 2024-01-15 NOTE — Telephone Encounter (Signed)
 The patient has been notified of the result and verbalized understanding.  All questions (if any) were answered. Carmelina Paddock, [place] 01/15/2024 8:24 AM

## 2024-01-28 LAB — BASIC METABOLIC PANEL
BUN/Creatinine Ratio: 16 (ref 9–23)
BUN: 12 mg/dL (ref 6–24)
CO2: 24 mmol/L (ref 20–29)
Calcium: 10.8 mg/dL — ABNORMAL HIGH (ref 8.7–10.2)
Chloride: 97 mmol/L (ref 96–106)
Creatinine, Ser: 0.77 mg/dL (ref 0.57–1.00)
Glucose: 173 mg/dL — ABNORMAL HIGH (ref 70–99)
Potassium: 4.5 mmol/L (ref 3.5–5.2)
Sodium: 137 mmol/L (ref 134–144)
eGFR: 93 mL/min/{1.73_m2} (ref >59)

## 2024-02-05 ENCOUNTER — Telehealth (HOSPITAL_COMMUNITY): Payer: Self-pay | Admitting: *Deleted

## 2024-02-05 NOTE — Telephone Encounter (Signed)
 Reaching out to patient to offer assistance regarding upcoming cardiac imaging study; pt verbalizes understanding of appt date/time, parking situation and where to check in, pre-test NPO status and medications ordered, and verified current allergies; name and call back number provided for further questions should they arise Johney Frame RN Navigator Cardiac Imaging [facility] [phone] office [phone] cell

## 2024-02-08 ENCOUNTER — Ambulatory Visit (HOSPITAL_COMMUNITY)
Admission: RE | Admit: 2024-02-08 | Discharge: 2024-02-08 | Disposition: A | Discharge status: Home / Self Care | Source: Ambulatory Visit | Attending: Internal Medicine | Admitting: Internal Medicine

## 2024-02-08 DIAGNOSIS — R9430 Abnormal result of cardiovascular function study, unspecified: Secondary | ICD-10-CM | POA: Diagnosis not present

## 2024-02-08 DIAGNOSIS — R9439 Abnormal result of other cardiovascular function study: Secondary | ICD-10-CM | POA: Diagnosis present

## 2024-02-08 DIAGNOSIS — R0609 Other forms of dyspnea: Secondary | ICD-10-CM | POA: Insufficient documentation

## 2024-02-08 DIAGNOSIS — Z136 Encounter for screening for cardiovascular disorders: Secondary | ICD-10-CM | POA: Insufficient documentation

## 2024-02-08 MED ORDER — NITROGLYCERIN 0.4 MG SL SUBL
0.8000 mg | SUBLINGUAL_TABLET | Freq: Once | SUBLINGUAL | Status: AC
  Administered 2024-02-08: 0.8 mg via SUBLINGUAL

## 2024-02-08 MED ORDER — IOHEXOL 350 MG/ML SOLN
95.0000 mL | Freq: Once | INTRAVENOUS | Status: AC | PRN
  Administered 2024-02-08: 95 mL via INTRAVENOUS

## 2024-02-08 MED ORDER — NITROGLYCERIN 0.4 MG SL SUBL
SUBLINGUAL_TABLET | SUBLINGUAL | Status: AC
  Filled 2024-02-08: qty 2

## 2024-02-08 MED ORDER — METOPROLOL TARTRATE 5 MG/5ML IV SOLN
INTRAVENOUS | Status: AC
  Filled 2024-02-08: qty 10

## 2024-02-08 MED ORDER — METOPROLOL TARTRATE 5 MG/5ML IV SOLN
5.0000 mg | Freq: Once | INTRAVENOUS | Status: AC
  Administered 2024-02-08: 5 mg via INTRAVENOUS

## 2024-02-17 ENCOUNTER — Encounter (HOSPITAL_COMMUNITY): Payer: Self-pay

## 2024-02-17 ENCOUNTER — Ambulatory Visit (HOSPITAL_COMMUNITY): Attending: Internal Medicine

## 2024-02-17 ENCOUNTER — Other Ambulatory Visit: Payer: Self-pay

## 2024-02-17 DIAGNOSIS — R29898 Other symptoms and signs involving the musculoskeletal system: Secondary | ICD-10-CM | POA: Insufficient documentation

## 2024-02-17 DIAGNOSIS — M25561 Pain in right knee: Secondary | ICD-10-CM | POA: Insufficient documentation

## 2024-02-17 DIAGNOSIS — G8929 Other chronic pain: Secondary | ICD-10-CM | POA: Diagnosis present

## 2024-02-17 NOTE — Therapy (Signed)
 OUTPATIENT PHYSICAL THERAPY LOWER EXTREMITY EVALUATION   Patient Name: Sherry Cole MRN: [id number] DOB:July 03, 1971, 53 y.o., female Today's Date: 02/17/2024  END OF SESSION:  PT End of Session - 02/17/24 1548     Visit Number 1    Date for PT Re-Evaluation 03/30/24    Authorization Type [place] MEDICAID HEALTHY BLUE    Authorization Time Period seeking auth    Progress Note Due on Visit 10    PT Start Time 1015    PT Stop Time 1100    PT Time Calculation (min) 45 min    Activity Tolerance Patient tolerated treatment well;Patient limited by pain    Behavior During Therapy WFL for tasks assessed/performed             Past Medical History:  Diagnosis Date   Asthma    Bipolar 1 disorder (HCC)    Depression    Diabetes mellitus without complication (HCC)    History of kidney stones    Hydrocephalus (HCC)    Migraine    Paranoid schizophrenia (HCC)    Personality disorder (HCC)    Psychotic disorder (HCC)    PTSD (post-traumatic stress disorder)    Schwannoma    Past Surgical History:  Procedure Laterality Date   CESAREAN SECTION     x2   TUBAL LIGATION     Patient Active Problem List   Diagnosis Date Noted   DOE (dyspnea on exertion) 12/15/2023   OAB (overactive bladder) 09/10/2023   Mixed stress and urge urinary incontinence 09/10/2023   History of kidney stones 07/21/2023   Trichomoniasis 06/01/2023   Urinary incontinence with continuous leakage 05/27/2023   Sleep disturbance 09/24/2022   Moody 09/24/2022   Hot flashes 09/24/2022   Perimenopause 09/24/2022   Cocaine use disorder (HCC) 08/29/2022   Adjustment disorder with mixed anxiety and depressed mood 08/29/2022   Substance induced mood disorder (HCC) 08/29/2022   GERD without esophagitis 02/29/2016   Umbilical hernia, reducible 02/29/2016   Asthma 01/10/2016    PCP: Alvina Filbert, MD   REFERRING PROVIDER: Alvina Filbert, MD  REFERRING DIAG: right knee pain  THERAPY DIAG:  Chronic pain of  right knee  Weakness of both lower extremities  Rationale for Evaluation and Treatment: Rehabilitation  ONSET DATE: 2017, pregnant with daughter is when the knee started behaving wierd  SUBJECTIVE:   SUBJECTIVE STATEMENT: Pt reports right knee pain starting when she was pregnant with her daughter in 2017. Pt state the knee started to act "weird" and have "given out" several times at various times and has caused her to fall forward about 5 times in the last 6 months. Pt reports pain rating of 7/10 after walking for 5 minutes before having to take seated breaks. Walking causes pain, can walk about a minute before it starts hurting.    PERTINENT HISTORY: -diagnosed with diabetes about 9 months ago -on weight loss medications PAIN:  Are you having pain? Yes: NPRS scale: 0/10 Pain location: medial hamstring area, IT band (medial side worse) Pain description: continuous sharp pain Aggravating factors: walking, stairs Relieving factors: rest, non weightbearing, keeping it bent  PRECAUTIONS: Fall  RED FLAGS: None Lost 26 pounds but on weight loss medication  WEIGHT BEARING RESTRICTIONS: No  FALLS:  Has patient fallen in last 6 months? Yes. Number of falls 5, forward right knee giving out  LIVING ENVIRONMENT: Lives with: lives with their family and lives with their partner Lives in: Mobile home Stairs:  WC ramp Has following equipment at home:  Single point cane  OCCUPATION: non-employed  PLOF: Independent  PATIENT GOALS: walk for a mile with no pain, go up and down stairs without fear of falling  NEXT MD VISIT: none reported  OBJECTIVE:  Note: Objective measures were completed at Evaluation unless otherwise noted.  DIAGNOSTIC FINDINGS: none of RLE  PATIENT SURVEYS:  LEFS 23/80  COGNITION: Overall cognitive status: Within functional limits for tasks assessed     SENSATION: WFL     PALPATION: Tenderness medial joint line, anterior knee  LOWER EXTREMITY  ROM:  Active ROM Right eval Left eval  Hip flexion    Hip extension    Hip abduction    Hip adduction    Hip internal rotation    Hip external rotation    Knee flexion 109, pain in right hip  115  Knee extension    Ankle dorsiflexion    Ankle plantarflexion    Ankle inversion    Ankle eversion     (Blank rows = not tested)  LOWER EXTREMITY MMT:  MMT Right eval Left eval  Hip flexion 4, pain 4, pain  Hip extension    Hip abduction 5 5  Hip adduction 5 5  Hip internal rotation    Hip external rotation    Knee flexion 3+, pain 4  Knee extension 3+, pain 4  Ankle dorsiflexion 5 5  Ankle plantarflexion    Ankle inversion    Ankle eversion     (Blank rows = not tested)  LOWER EXTREMITY SPECIAL TESTS:  Knee special tests: McMurray's test: positive   FUNCTIONAL TESTS:  5 times sit to stand: 19.36, right knee feels wobbly 2 minute walk test: 400 feet, pain in the right knee 2/10  Stair ambulation: TBA next session GAIT: Distance walked: 400 feet Assistive device utilized: None Level of assistance: Complete Independence Comments: decreased velocity, especially at the end of the gait assessment, pt demonstrates somewhat symmetrical gait pattern with increased heel strike noticed on the RLE as well as decreased stance time on RLE on occasion.                                                                                                                                TREATMENT DATE:  02/17/2024  Evaluation: -ROM measured, Strength assessed, HEP prescribed, pt educated on prognosis, findings, and importance of HEP compliance if given.  Therapeutic Exercise: -Supine bridges 2 sets of 8 reps, 3 second holds, symptomatic (held after pain rating increased to 6/10), pt cued for max hip extension -STS x5, functional assessment -LAQ 1 set of 10 reps     PATIENT EDUCATION:  Education details: Pt was educated on findings of PT evaluation, prognosis, frequency of therapy  visits and rationale, attendance policy, and HEP if given.   Person educated: Patient Education method: Explanation and Handouts Education comprehension: verbalized understanding and needs further education  HOME EXERCISE PROGRAM: Access Code: [email or web address] URL: [email or web address] Date: 02/17/2024 Prepared by:  Luz Lex  Exercises - Seated Long Arc Quad  - 1 x daily - 3 sets - 10 reps - Standing Heel Raise with Support  - 1 x daily - 3 sets - 10 reps - Sit to Stand with Arms Crossed  - 1 x daily - 3 sets - 10 reps  ASSESSMENT:  CLINICAL IMPRESSION: Patient is a 53 y.o. female who was seen today for physical therapy evaluation and treatment for right knee pain.   Patient demonstrates decreased LE strength, abnormal pain rating, and impaired gait quality. Patient also demonstrates difficulty with ambulation during today's session with decreased stride length and velocity noted. Patient also demonstrates palpable tenderness ot medial joint line and extension over pressure of R knee. Patient requires education on POC and on plan to use kinesiotherapy in the future after a couple of weeks of HEP compliance. Patient would benefit from skilled physical therapy for increased endurance with ambulation, increased LE strength, decreased RLE pain and improved balance for improved gait quality, return to higher level of function with ADLs, and progress towards therapy goals.   OBJECTIVE IMPAIRMENTS: Abnormal gait, decreased activity tolerance, decreased balance, decreased endurance, decreased mobility, difficulty walking, decreased ROM, decreased strength, obesity, and pain.   ACTIVITY LIMITATIONS: carrying, lifting, bending, standing, squatting, stairs, transfers, and locomotion level  PARTICIPATION LIMITATIONS: meal prep, cleaning, shopping, community activity, and yard work  PERSONAL FACTORS: Age, Fitness, Past/current experiences, Time since onset of injury/illness/exacerbation,  and 1 comorbidity: diabetes  are also affecting patient's functional outcome.   REHAB POTENTIAL: Good  CLINICAL DECISION MAKING: Stable/uncomplicated  EVALUATION COMPLEXITY: Low   GOALS: Goals reviewed with patient? No  SHORT TERM GOALS: Target date: 03/09/24  Patient will demonstrate evidence of independence with individualized HEP and will report compliance for at least 3 days per week for optimized progression towards remaining therapy goals. Baseline:  Goal status: INITIAL  2.  Patient will report a decrease in pain level during community ambulation by at least 2 points for improved quality of life. Baseline: see above Goal status: INITIAL     LONG TERM GOALS: Target date: 03/30/24  Pt will demonstrate a an increase of at least 9 points on the LEFS for improved performance of community ambulation and ADL. Baseline: see above Goal status: INITIAL  2.  Pt will improve 2 MWT by 140 feet (and decrease pain rating) in order to demonstrate improved functional ambulatory capacity in community setting.  Baseline: see above Goal status: INITIAL  3.  Pt will demonstrate 5 degree increase in R knee ROM (flexion and extension) in right knee, for increased mobility and maximal efficiency of gait cycle during ambulation. Baseline: see above Goal status: INITIAL  4.  Pt will demonstrate at least 4/5 MMT for right lower extremity for increased strength during ADL and community ambulation. Baseline: see above Goal status: INITIAL  5.  Pt will report ability to walk for extended distances, longer than 3 minutes, without pain for increased endurance and increased quality of life. Baseline: unable to walk 2 minutes without increase in pain Goal status: INITIAL    PLAN:  PT FREQUENCY: 2x/week  PT DURATION: 6 weeks  PLANNED INTERVENTIONS: 97110-Therapeutic exercises, 97530- Therapeutic activity, 97112- Neuromuscular re-education, 97535- Self Care, 16109- Manual therapy, [phone]-  Gait training, Patient/Family education, Balance training, Stair training, Taping, Joint mobilization, Cryotherapy, and Moist heat  PLAN FOR NEXT SESSION: progress SLS, BLE strengthening, and endurance training   Luz Lex, PT, DPT [facility] Office: [phone] 4:11  PM, 02/17/24   Managed Medicaid Authorization Request  Visit Dx Codes: W09.811 ; M25.561, G89.29 ; right knee pain  Functional Tool Score: LEFS 23/80  For all possible CPT codes, reference the Planned Interventions line above.     Check all conditions that are expected to impact treatment: {Conditions expected to impact treatment:Morbid obesity   If treatment provided at initial evaluation, no treatment charged due to lack of authorization.

## 2024-02-25 ENCOUNTER — Ambulatory Visit (HOSPITAL_COMMUNITY)

## 2024-02-25 ENCOUNTER — Encounter (HOSPITAL_COMMUNITY): Payer: Self-pay

## 2024-02-25 DIAGNOSIS — R29898 Other symptoms and signs involving the musculoskeletal system: Secondary | ICD-10-CM

## 2024-02-25 DIAGNOSIS — M25561 Pain in right knee: Secondary | ICD-10-CM | POA: Diagnosis not present

## 2024-02-25 DIAGNOSIS — G8929 Other chronic pain: Secondary | ICD-10-CM

## 2024-02-25 NOTE — Therapy (Signed)
 OUTPATIENT PHYSICAL THERAPY LOWER EXTREMITY TREATMENT   Patient Name: Deshonda Cryderman MRN: [id number] DOB:01/02/1971, 53 y.o., female Today's Date: 02/25/2024  END OF SESSION:  PT End of Session - 02/25/24 1255     Visit Number 2    Number of Visits 12    Date for PT Re-Evaluation 03/30/24    Authorization Type [place] MEDICAID HEALTHY BLUE    Authorization Time Period healthy blue approved 4 visits from 02/25/24-03/25/24    Progress Note Due on Visit 10    PT Start Time 1300    PT Stop Time 1344    PT Time Calculation (min) 44 min    Activity Tolerance Patient tolerated treatment well;Patient limited by pain    Behavior During Therapy [facility] for tasks assessed/performed             Past Medical History:  Diagnosis Date   Asthma    Bipolar 1 disorder (HCC)    Depression    Diabetes mellitus without complication (HCC)    History of kidney stones    Hydrocephalus (HCC)    Migraine    Paranoid schizophrenia (HCC)    Personality disorder (HCC)    Psychotic disorder (HCC)    PTSD (post-traumatic stress disorder)    Schwannoma    Past Surgical History:  Procedure Laterality Date   CESAREAN SECTION     x2   TUBAL LIGATION     Patient Active Problem List   Diagnosis Date Noted   DOE (dyspnea on exertion) 12/15/2023   OAB (overactive bladder) 09/10/2023   Mixed stress and urge urinary incontinence 09/10/2023   History of kidney stones 07/21/2023   Trichomoniasis 06/01/2023   Urinary incontinence with continuous leakage 05/27/2023   Sleep disturbance 09/24/2022   Moody 09/24/2022   Hot flashes 09/24/2022   Perimenopause 09/24/2022   Cocaine use disorder (HCC) 08/29/2022   Adjustment disorder with mixed anxiety and depressed mood 08/29/2022   Substance induced mood disorder (HCC) 08/29/2022   GERD without esophagitis 02/29/2016   Umbilical hernia, reducible 02/29/2016   Asthma 01/10/2016    PCP: Twylla Galen, MD   REFERRING PROVIDER: Twylla Galen,  MD  REFERRING DIAG: right knee pain  THERAPY DIAG:  Chronic pain of right knee  Weakness of both lower extremities  Rationale for Evaluation and Treatment: Rehabilitation  ONSET DATE: 2017, pregnant with daughter is when the knee started behaving wierd  SUBJECTIVE:   SUBJECTIVE STATEMENT: 02/25/24:  Pt reports some stiffness and pain scale 2/10 with weight bearing.  Has began HEP without questions, stated her hips feel more mobile.  Stated her calf muscles are sore today.  Evals: Pt reports right knee pain starting when she was pregnant with her daughter in 2017. Pt state the knee started to act "weird" and have "given out" several times at various times and has caused her to fall forward about 5 times in the last 6 months. Pt reports pain rating of 7/10 after walking for 5 minutes before having to take seated breaks. Walking causes pain, can walk about a minute before it starts hurting.    PERTINENT HISTORY: -diagnosed with diabetes about 9 months ago -on weight loss medications PAIN:  Are you having pain? Yes: NPRS scale: 0/10 Pain location: medial hamstring area, IT band (medial side worse) Pain description: continuous sharp pain Aggravating factors: walking, stairs Relieving factors: rest, non weightbearing, keeping it bent  PRECAUTIONS: Fall  RED FLAGS: None Lost 26 pounds but on weight loss medication  WEIGHT BEARING RESTRICTIONS: No  FALLS:  Has patient fallen in last 6 months? Yes. Number of falls 5, forward right knee giving out  LIVING ENVIRONMENT: Lives with: lives with their family and lives with their partner Lives in: Mobile home Stairs:  WC ramp Has following equipment at home: Single point cane  OCCUPATION: non-employed  PLOF: Independent  PATIENT GOALS: walk for a mile with no pain, go up and down stairs without fear of falling  NEXT MD VISIT: none reported  OBJECTIVE:  Note: Objective measures were completed at Evaluation unless otherwise  noted.  DIAGNOSTIC FINDINGS: none of RLE  PATIENT SURVEYS:  LEFS 23/80  COGNITION: Overall cognitive status: Within functional limits for tasks assessed     SENSATION: WFL     PALPATION: Tenderness medial joint line, anterior knee  LOWER EXTREMITY ROM:  Active ROM Right eval Left eval  Hip flexion    Hip extension    Hip abduction    Hip adduction    Hip internal rotation    Hip external rotation    Knee flexion 109, pain in right hip  115  Knee extension    Ankle dorsiflexion    Ankle plantarflexion    Ankle inversion    Ankle eversion     (Blank rows = not tested)  LOWER EXTREMITY MMT:  MMT Right eval Left eval  Hip flexion 4, pain 4, pain  Hip extension    Hip abduction 5 5  Hip adduction 5 5  Hip internal rotation    Hip external rotation    Knee flexion 3+, pain 4  Knee extension 3+, pain 4  Ankle dorsiflexion 5 5  Ankle plantarflexion    Ankle inversion    Ankle eversion     (Blank rows = not tested)  LOWER EXTREMITY SPECIAL TESTS:  Knee special tests: McMurray's test: positive   FUNCTIONAL TESTS:  5 times sit to stand: 19.36, right knee feels wobbly 2 minute walk test: 400 feet, pain in the right knee 2/10  Stair ambulation: TBA next session GAIT: Distance walked: 400 feet Assistive device utilized: None Level of assistance: Complete Independence Comments: decreased velocity, especially at the end of the gait assessment, pt demonstrates somewhat symmetrical gait pattern with increased heel strike noticed on the RLE as well as decreased stance time on RLE on occasion.                                                                                                                                TREATMENT DATE:  02/25/24:   Reviewed goals Educated importance of HEP compliance for maximal benefits  Stairs 7in step height alternating with no HR ascend, 1 HR descending with c/o discomfort  Supine Bridges- DC due to pain. Heelsides 2-124  degrees SLR 10x  Seated: STS 10x eccentric control no HHA  Standing:  Mini-wall squats  2sets 5 reps 3" holds Heel raises 10x3" Gastroc stretch against wall 3x 30 SLS Lt 12", Rt  20" Step up 6in 10x with 1 HHA  Bike:  5' seat 12 full revolution  02/17/2024  Evaluation: -ROM measured, Strength assessed, HEP prescribed, pt educated on prognosis, findings, and importance of HEP compliance if given.  Therapeutic Exercise: -Supine bridges 2 sets of 8 reps, 3 second holds, symptomatic (held after pain rating increased to 6/10), pt cued for max hip extension -STS x5, functional assessment -LAQ 1 set of 10 reps     PATIENT EDUCATION:  Education details: Pt was educated on findings of PT evaluation, prognosis, frequency of therapy visits and rationale, attendance policy, and HEP if given.   Person educated: Patient Education method: Explanation and Handouts Education comprehension: verbalized understanding and needs further education  HOME EXERCISE PROGRAM: Access Code: [email or web address] URL: [email or web address] Date: 02/17/2024 Prepared by: Armond Bertin  Exercises - Seated Long Arc Quad  - 1 x daily - 3 sets - 10 reps - Standing Heel Raise with Support  - 1 x daily - 3 sets - 10 reps - Sit to Stand with Arms Crossed  - 1 x daily - 3 sets - 10 reps  ASSESSMENT:  CLINICAL IMPRESSION: 02/25/24:  Reviewed goals and educated importance of HEP compliance for maximal benefits.  Pt able to recall and demonstrate appropriate form and position with current exercise program.  Session focus with knee mobility and strengthening, monitor pain through session.   Stair ambulating complete with ability to ambulate reciprocal pattern, 1 HR needed descending stairs, pt fearful knee will buckle.  Pt presents with visible quad fatigue with additional standing strengthening exercises.  Added gastroc stretch to HEP with printout given and verbalized understanding.  No reports of increased pain  through.  Eval:  Patient is a 53 y.o. female who was seen today for physical therapy evaluation and treatment for right knee pain.   Patient demonstrates decreased LE strength, abnormal pain rating, and impaired gait quality. Patient also demonstrates difficulty with ambulation during today's session with decreased stride length and velocity noted. Patient also demonstrates palpable tenderness ot medial joint line and extension over pressure of R knee. Patient requires education on POC and on plan to use kinesiotherapy in the future after a couple of weeks of HEP compliance. Patient would benefit from skilled physical therapy for increased endurance with ambulation, increased LE strength, decreased RLE pain and improved balance for improved gait quality, return to higher level of function with ADLs, and progress towards therapy goals.   OBJECTIVE IMPAIRMENTS: Abnormal gait, decreased activity tolerance, decreased balance, decreased endurance, decreased mobility, difficulty walking, decreased ROM, decreased strength, obesity, and pain.   ACTIVITY LIMITATIONS: carrying, lifting, bending, standing, squatting, stairs, transfers, and locomotion level  PARTICIPATION LIMITATIONS: meal prep, cleaning, shopping, community activity, and yard work  PERSONAL FACTORS: Age, Fitness, Past/current experiences, Time since onset of injury/illness/exacerbation, and 1 comorbidity: diabetes  are also affecting patient's functional outcome.   REHAB POTENTIAL: Good  CLINICAL DECISION MAKING: Stable/uncomplicated  EVALUATION COMPLEXITY: Low   GOALS: Goals reviewed with patient? No  SHORT TERM GOALS: Target date: 03/09/24  Patient will demonstrate evidence of independence with individualized HEP and will report compliance for at least 3 days per week for optimized progression towards remaining therapy goals. Baseline:  Goal status: INITIAL  2.  Patient will report a decrease in pain level during community  ambulation by at least 2 points for improved quality of life. Baseline: see above Goal status: INITIAL     LONG TERM GOALS: Target date: 03/30/24  Pt will demonstrate a an  increase of at least 9 points on the LEFS for improved performance of community ambulation and ADL. Baseline: see above Goal status: INITIAL  2.  Pt will improve 2 MWT by 140 feet (and decrease pain rating) in order to demonstrate improved functional ambulatory capacity in community setting.  Baseline: see above Goal status: INITIAL  3.  Pt will demonstrate 5 degree increase in R knee ROM (flexion and extension) in right knee, for increased mobility and maximal efficiency of gait cycle during ambulation. Baseline: see above Goal status: INITIAL  4.  Pt will demonstrate at least 4/5 MMT for right lower extremity for increased strength during ADL and community ambulation. Baseline: see above Goal status: INITIAL  5.  Pt will report ability to walk for extended distances, longer than 3 minutes, without pain for increased endurance and increased quality of life. Baseline: unable to walk 2 minutes without increase in pain Goal status: INITIAL    PLAN:  PT FREQUENCY: 2x/week  PT DURATION: 6 weeks  PLANNED INTERVENTIONS: 97110-Therapeutic exercises, 97530- Therapeutic activity, [id number]- Neuromuscular re-education, 97535- Self Care, 16109- Manual therapy, [phone]- Gait training, Patient/Family education, Balance training, Stair training, Taping, Joint mobilization, Cryotherapy, and Moist heat  PLAN FOR NEXT SESSION: progress SLS, BLE strengthening, and endurance training.  Add squats and lateral step up next session.  Minor Amble, LPTA/CLT; Johnye Napoleon [phone]  2:36 PM, 02/25/24

## 2024-03-02 ENCOUNTER — Ambulatory Visit (HOSPITAL_COMMUNITY)

## 2024-03-02 DIAGNOSIS — M25561 Pain in right knee: Secondary | ICD-10-CM | POA: Diagnosis not present

## 2024-03-02 DIAGNOSIS — R29898 Other symptoms and signs involving the musculoskeletal system: Secondary | ICD-10-CM

## 2024-03-02 DIAGNOSIS — G8929 Other chronic pain: Secondary | ICD-10-CM

## 2024-03-02 NOTE — Therapy (Signed)
 OUTPATIENT PHYSICAL THERAPY LOWER EXTREMITY TREATMENT   Patient Name: Sherry Cole MRN: [id number] DOB:Feb 25, 1971, 53 y.o., female Today's Date: 03/02/2024  END OF SESSION:  PT End of Session - 03/02/24 1014     Visit Number 3    Number of Visits 12    Date for PT Re-Evaluation 03/30/24    Authorization Type [place] MEDICAID HEALTHY BLUE    Authorization Time Period healthy blue approved 4 visits from 02/25/24-03/25/24    Authorization - Visit Number 2    Authorization - Number of Visits 4    Progress Note Due on Visit 4    PT Start Time 1015    PT Stop Time 1055    PT Time Calculation (min) 40 min    Activity Tolerance Patient tolerated treatment well;Patient limited by pain    Behavior During Therapy [facility] for tasks assessed/performed             Past Medical History:  Diagnosis Date   Asthma    Bipolar 1 disorder (HCC)    Depression    Diabetes mellitus without complication (HCC)    History of kidney stones    Hydrocephalus (HCC)    Migraine    Paranoid schizophrenia (HCC)    Personality disorder (HCC)    Psychotic disorder (HCC)    PTSD (post-traumatic stress disorder)    Schwannoma    Past Surgical History:  Procedure Laterality Date   CESAREAN SECTION     x2   TUBAL LIGATION     Patient Active Problem List   Diagnosis Date Noted   DOE (dyspnea on exertion) 12/15/2023   OAB (overactive bladder) 09/10/2023   Mixed stress and urge urinary incontinence 09/10/2023   History of kidney stones 07/21/2023   Trichomoniasis 06/01/2023   Urinary incontinence with continuous leakage 05/27/2023   Sleep disturbance 09/24/2022   Moody 09/24/2022   Hot flashes 09/24/2022   Perimenopause 09/24/2022   Cocaine use disorder (HCC) 08/29/2022   Adjustment disorder with mixed anxiety and depressed mood 08/29/2022   Substance induced mood disorder (HCC) 08/29/2022   GERD without esophagitis 02/29/2016   Umbilical hernia, reducible 02/29/2016   Asthma 01/10/2016     PCP: Twylla Galen, MD   REFERRING PROVIDER: Twylla Galen, MD  REFERRING DIAG: right knee pain  THERAPY DIAG:  Chronic pain of right knee  Weakness of both lower extremities  Rationale for Evaluation and Treatment: Rehabilitation  ONSET DATE: 2017, pregnant with daughter is when the knee started behaving wierd  SUBJECTIVE:   SUBJECTIVE STATEMENT: Has not done her exercises since last visit but has been busy [place] hunting and visited a museum and was able to walk a lot.  Right knee pain 4/10 today on arrival.    Evals: Pt reports right knee pain starting when she was pregnant with her daughter in 2017. Pt state the knee started to act "weird" and have "given out" several times at various times and has caused her to fall forward about 5 times in the last 6 months. Pt reports pain rating of 7/10 after walking for 5 minutes before having to take seated breaks. Walking causes pain, can walk about a minute before it starts hurting.    PERTINENT HISTORY: -diagnosed with diabetes about 9 months ago -on weight loss medications PAIN:  Are you having pain? Yes: NPRS scale: 0/10 Pain location: medial hamstring area, IT band (medial side worse) Pain description: continuous sharp pain Aggravating factors: walking, stairs Relieving factors: rest, non weightbearing, keeping it bent  PRECAUTIONS: Fall  RED FLAGS: None Lost 26 pounds but on weight loss medication  WEIGHT BEARING RESTRICTIONS: No  FALLS:  Has patient fallen in last 6 months? Yes. Number of falls 5, forward right knee giving out  LIVING ENVIRONMENT: Lives with: lives with their family and lives with their partner Lives in: Mobile home Stairs:  WC ramp Has following equipment at home: Single point cane  OCCUPATION: non-employed  PLOF: Independent  PATIENT GOALS: walk for a mile with no pain, go up and down stairs without fear of falling  NEXT MD VISIT: none reported  OBJECTIVE:  Note: Objective  measures were completed at Evaluation unless otherwise noted.  DIAGNOSTIC FINDINGS: none of RLE  PATIENT SURVEYS:  LEFS 23/80  COGNITION: Overall cognitive status: Within functional limits for tasks assessed     SENSATION: WFL     PALPATION: Tenderness medial joint line, anterior knee  LOWER EXTREMITY ROM:  Active ROM Right eval Left eval  Hip flexion    Hip extension    Hip abduction    Hip adduction    Hip internal rotation    Hip external rotation    Knee flexion 109, pain in right hip  115  Knee extension    Ankle dorsiflexion    Ankle plantarflexion    Ankle inversion    Ankle eversion     (Blank rows = not tested)  LOWER EXTREMITY MMT:  MMT Right eval Left eval  Hip flexion 4, pain 4, pain  Hip extension    Hip abduction 5 5  Hip adduction 5 5  Hip internal rotation    Hip external rotation    Knee flexion 3+, pain 4  Knee extension 3+, pain 4  Ankle dorsiflexion 5 5  Ankle plantarflexion    Ankle inversion    Ankle eversion     (Blank rows = not tested)  LOWER EXTREMITY SPECIAL TESTS:  Knee special tests: McMurray's test: positive   FUNCTIONAL TESTS:  5 times sit to stand: 19.36, right knee feels wobbly 2 minute walk test: 400 feet, pain in the right knee 2/10  Stair ambulation: TBA next session GAIT: Distance walked: 400 feet Assistive device utilized: None Level of assistance: Complete Independence Comments: decreased velocity, especially at the end of the gait assessment, pt demonstrates somewhat symmetrical gait pattern with increased heel strike noticed on the RLE as well as decreased stance time on RLE on occasion.                                                                                                                                TREATMENT DATE:  03/02/24 Bike seat 11 x 5' dynamic warm up Heel raises 2 x 10 Slant board 5 x 20" Squats to chair for target 2 x 10 Lateral step ups 4" 2 x 10 each Seated: 2# LAQ's 2 x  10 2# hip flexion 2 x 10 Hip vectors x 5 each  02/25/24:   Reviewed goals Educated importance of HEP compliance for maximal benefits  Stairs 7in step height alternating with no HR ascend, 1 HR descending with c/o discomfort  Supine Bridges- DC due to pain. Heelsides 2-124 degrees SLR 10x  Seated: STS 10x eccentric control no HHA  Standing:  Mini-wall squats  2sets 5 reps 3" holds Heel raises 10x3" Gastroc stretch against wall 3x 30 SLS Lt 12", Rt 20" Step up 6in 10x with 1 HHA  Bike:  5' seat 12 full revolution  02/17/2024  Evaluation: -ROM measured, Strength assessed, HEP prescribed, pt educated on prognosis, findings, and importance of HEP compliance if given.  Therapeutic Exercise: -Supine bridges 2 sets of 8 reps, 3 second holds, symptomatic (held after pain rating increased to 6/10), pt cued for max hip extension -STS x5, functional assessment -LAQ 1 set of 10 reps     PATIENT EDUCATION:  Education details: Pt was educated on findings of PT evaluation, prognosis, frequency of therapy visits and rationale, attendance policy, and HEP if given.   Person educated: Patient Education method: Explanation and Handouts Education comprehension: verbalized understanding and needs further education  HOME EXERCISE PROGRAM: Access Code: [email or web address] URL: [email or web address] Date: 02/17/2024 Prepared by: Armond Bertin  Exercises - Seated Long Arc Quad  - 1 x daily - 3 sets - 10 reps - Standing Heel Raise with Support  - 1 x daily - 3 sets - 10 reps - Sit to Stand with Arms Crossed  - 1 x daily - 3 sets - 10 reps  ASSESSMENT:  CLINICAL IMPRESSION: 03/02/24 Today's session with focus on lower extremity mobility and strength.  Added squats and lateral step ups today for continued strengthening.  Reminded patient of the importance of compliance with HEP for good outcomes.  Moderate fatigue noted with exercise today with reports of "my legs feeling tired"; needed a  few rest breaks during treatment. Did not update HEP as she has not been compliant with previous HEP.   Patient will benefit from continued skilled therapy services to address deficits and promote return to optimal function.       Eval:  Patient is a 53 y.o. female who was seen today for physical therapy evaluation and treatment for right knee pain.   Patient demonstrates decreased LE strength, abnormal pain rating, and impaired gait quality. Patient also demonstrates difficulty with ambulation during today's session with decreased stride length and velocity noted. Patient also demonstrates palpable tenderness ot medial joint line and extension over pressure of R knee. Patient requires education on POC and on plan to use kinesiotherapy in the future after a couple of weeks of HEP compliance. Patient would benefit from skilled physical therapy for increased endurance with ambulation, increased LE strength, decreased RLE pain and improved balance for improved gait quality, return to higher level of function with ADLs, and progress towards therapy goals.   OBJECTIVE IMPAIRMENTS: Abnormal gait, decreased activity tolerance, decreased balance, decreased endurance, decreased mobility, difficulty walking, decreased ROM, decreased strength, obesity, and pain.   ACTIVITY LIMITATIONS: carrying, lifting, bending, standing, squatting, stairs, transfers, and locomotion level  PARTICIPATION LIMITATIONS: meal prep, cleaning, shopping, community activity, and yard work  PERSONAL FACTORS: Age, Fitness, Past/current experiences, Time since onset of injury/illness/exacerbation, and 1 comorbidity: diabetes  are also affecting patient's functional outcome.   REHAB POTENTIAL: Good  CLINICAL DECISION MAKING: Stable/uncomplicated  EVALUATION COMPLEXITY: Low   GOALS: Goals reviewed with patient? No  SHORT TERM GOALS: Target date: 03/09/24  Patient will demonstrate evidence of  independence with individualized HEP  and will report compliance for at least 3 days per week for optimized progression towards remaining therapy goals. Baseline:  Goal status: IN PROGRESS  2.  Patient will report a decrease in pain level during community ambulation by at least 2 points for improved quality of life. Baseline: see above Goal status: IN PROGRESS     LONG TERM GOALS: Target date: 03/30/24  Pt will demonstrate a an increase of at least 9 points on the LEFS for improved performance of community ambulation and ADL. Baseline: see above Goal status: IN PROGRESS  2.  Pt will improve 2 MWT by 140 feet (and decrease pain rating) in order to demonstrate improved functional ambulatory capacity in community setting.  Baseline: see above Goal status: IN PROGRESS  3.  Pt will demonstrate 5 degree increase in R knee ROM (flexion and extension) in right knee, for increased mobility and maximal efficiency of gait cycle during ambulation. Baseline: see above Goal status: IN PROGRESS  4.  Pt will demonstrate at least 4/5 MMT for right lower extremity for increased strength during ADL and community ambulation. Baseline: see above Goal status: IN PROGRESS  5.  Pt will report ability to walk for extended distances, longer than 3 minutes, without pain for increased endurance and increased quality of life. Baseline: unable to walk 2 minutes without increase in pain Goal status: IN PROGRESS    PLAN:  PT FREQUENCY: 2x/week  PT DURATION: 6 weeks  PLANNED INTERVENTIONS: 97110-Therapeutic exercises, 97530- Therapeutic activity, [id number]- Neuromuscular re-education, 97535- Self Care, [id number]- Manual therapy, [phone]- Gait training, Patient/Family education, Balance training, Stair training, Taping, Joint mobilization, Cryotherapy, and Moist heat  PLAN FOR NEXT SESSION: progress SLS, BLE strengthening, and endurance training.  Progress balance exercise  10:43 AM, 03/02/24 Retia Cordle Small Staley Lunz MPT [place] [facility] [place]  [phone]

## 2024-03-03 ENCOUNTER — Ambulatory Visit (HOSPITAL_COMMUNITY)

## 2024-03-03 DIAGNOSIS — G8929 Other chronic pain: Secondary | ICD-10-CM

## 2024-03-03 DIAGNOSIS — R29898 Other symptoms and signs involving the musculoskeletal system: Secondary | ICD-10-CM

## 2024-03-03 DIAGNOSIS — M25561 Pain in right knee: Secondary | ICD-10-CM | POA: Diagnosis not present

## 2024-03-03 NOTE — Therapy (Signed)
 OUTPATIENT PHYSICAL THERAPY LOWER EXTREMITY TREATMENT/ PROGRESS NOTE Progress Note Reporting Period 02/17/2024 to 03/03/2024  See note below for Objective Data and Assessment of Progress/Goals.       Patient Name: Sherry Cole MRN: [id number] DOB:[birth year], 53 y.o., female Today's Date: 03/03/2024  END OF SESSION:  PT End of Session - 03/03/24 1258     Visit Number 4    Number of Visits 12    Date for PT Re-Evaluation 03/30/24    Authorization Type [place] MEDICAID HEALTHY BLUE    Authorization Time Period healthy blue approved 4 visits from 02/25/24-03/25/24; requested more auth 4/24 visit    Authorization - Visit Number 3    Authorization - Number of Visits 4    Progress Note Due on Visit 4    PT Start Time 1300    PT Stop Time 1340    PT Time Calculation (min) 40 min    Activity Tolerance Patient tolerated treatment well;Patient limited by pain    Behavior During Therapy [facility] for tasks assessed/performed             Past Medical History:  Diagnosis Date   Asthma    Bipolar 1 disorder (HCC)    Depression    Diabetes mellitus without complication (HCC)    History of kidney stones    Hydrocephalus (HCC)    Migraine    Paranoid schizophrenia (HCC)    Personality disorder (HCC)    Psychotic disorder (HCC)    PTSD (post-traumatic stress disorder)    Schwannoma    Past Surgical History:  Procedure Laterality Date   CESAREAN SECTION     x2   TUBAL LIGATION     Patient Active Problem List   Diagnosis Date Noted   DOE (dyspnea on exertion) 12/15/2023   OAB (overactive bladder) 09/10/2023   Mixed stress and urge urinary incontinence 09/10/2023   History of kidney stones 07/21/2023   Trichomoniasis 06/01/2023   Urinary incontinence with continuous leakage 05/27/2023   Sleep disturbance 09/24/2022   Moody 09/24/2022   Hot flashes 09/24/2022   Perimenopause 09/24/2022   Cocaine use disorder (HCC) 08/29/2022   Adjustment disorder with mixed anxiety and  depressed mood 08/29/2022   Substance induced mood disorder (HCC) 08/29/2022   GERD without esophagitis 02/29/2016   Umbilical hernia, reducible 02/29/2016   Asthma 01/10/2016    PCP: Twylla Galen, MD   REFERRING PROVIDER: Twylla Galen, MD  REFERRING DIAG: right knee pain  THERAPY DIAG:  Chronic pain of right knee  Weakness of both lower extremities  Rationale for Evaluation and Treatment: Rehabilitation  ONSET DATE: 2017, pregnant with daughter is when the knee started behaving wierd  SUBJECTIVE:   SUBJECTIVE STATEMENT: About "20%" better overall since start of therapy; Patient reports soreness today especially in her glutes from therapy yesterday.   Evals: Pt reports right knee pain starting when she was pregnant with her daughter in 2017. Pt state the knee started to act "weird" and have "given out" several times at various times and has caused her to fall forward about 5 times in the last 6 months. Pt reports pain rating of 7/10 after walking for 5 minutes before having to take seated breaks. Walking causes pain, can walk about a minute before it starts hurting.    PERTINENT HISTORY: -diagnosed with diabetes about 9 months ago -on weight loss medications PAIN:  Are you having pain? Yes: NPRS scale: 0/10 Pain location: medial hamstring area, IT band (medial side worse) Pain description: continuous sharp  pain Aggravating factors: walking, stairs Relieving factors: rest, non weightbearing, keeping it bent  PRECAUTIONS: Fall  RED FLAGS: None Lost 26 pounds but on weight loss medication  WEIGHT BEARING RESTRICTIONS: No  FALLS:  Has patient fallen in last 6 months? Yes. Number of falls 5, forward right knee giving out  LIVING ENVIRONMENT: Lives with: lives with their family and lives with their partner Lives in: Mobile home Stairs:  WC ramp Has following equipment at home: Single point cane  OCCUPATION: non-employed  PLOF: Independent  PATIENT GOALS:  walk for a mile with no pain, go up and down stairs without fear of falling  NEXT MD VISIT: none reported  OBJECTIVE:  Note: Objective measures were completed at Evaluation unless otherwise noted.  DIAGNOSTIC FINDINGS: none of RLE  PATIENT SURVEYS:  LEFS 23/80  COGNITION: Overall cognitive status: Within functional limits for tasks assessed     SENSATION: WFL     PALPATION: Tenderness medial joint line, anterior knee  LOWER EXTREMITY ROM:  Active ROM Right eval Left eval  Hip flexion    Hip extension    Hip abduction    Hip adduction    Hip internal rotation    Hip external rotation    Knee flexion 109, pain in right hip  115  Knee extension    Ankle dorsiflexion    Ankle plantarflexion    Ankle inversion    Ankle eversion     (Blank rows = not tested)  LOWER EXTREMITY MMT:  MMT Right eval Left eval Right  03/03/24 Left 03/03/24  Hip flexion 4, pain 4, pain 4+ 4+  Hip extension      Hip abduction 5 5    Hip adduction 5 5    Hip internal rotation      Hip external rotation      Knee flexion 3+, pain 4    Knee extension 3+, pain 4 4 4+  Ankle dorsiflexion 5 5    Ankle plantarflexion      Ankle inversion      Ankle eversion       (Blank rows = not tested)  LOWER EXTREMITY SPECIAL TESTS:  Knee special tests: McMurray's test: positive   FUNCTIONAL TESTS:  5 times sit to stand: 19.36, right knee feels wobbly 2 minute walk test: 400 feet, pain in the right knee 2/10  Stair ambulation: TBA next session GAIT: Distance walked: 400 feet Assistive device utilized: None Level of assistance: Complete Independence Comments: decreased velocity, especially at the end of the gait assessment, pt demonstrates somewhat symmetrical gait pattern with increased heel strike noticed on the RLE as well as decreased stance time on RLE on occasion.                                                                                                                                 TREATMENT DATE:  03/03/24 Bike seat 11 x 5' level 2 dynamic warm  up Mini progress note for insurance authorization 5 times sit to stand  11.09 2 MWT 471 ft LEFS 30/80 37.5% MMT's see above  Seated  Bilateral H/S stretch 3 x 20" Piriformis stretch 3 x 20" Standing: Calf raises on step x 10 Calf stretching at wall 3 x 20" Alternating lunges on 7" step with light UE support x 10 each 7" lateral step ups x 10 each   03/02/24 Bike seat 11 x 5' dynamic warm up Heel raises 2 x 10 Slant board 5 x 20" Squats to chair for target 2 x 10 Lateral step ups 4" 2 x 10 each Seated: 2# LAQ's 2 x 10 2# hip flexion 2 x 10 Hip vectors x 5 each   02/25/24:   Reviewed goals Educated importance of HEP compliance for maximal benefits  Stairs 7in step height alternating with no HR ascend, 1 HR descending with c/o discomfort  Supine Bridges- DC due to pain. Heelsides 2-124 degrees SLR 10x  Seated: STS 10x eccentric control no HHA  Standing:  Mini-wall squats  2sets 5 reps 3" holds Heel raises 10x3" Gastroc stretch against wall 3x 30 SLS Lt 12", Rt 20" Step up 6in 10x with 1 HHA  Bike:  5' seat 12 full revolution  02/17/2024  Evaluation: -ROM measured, Strength assessed, HEP prescribed, pt educated on prognosis, findings, and importance of HEP compliance if given.  Therapeutic Exercise: -Supine bridges 2 sets of 8 reps, 3 second holds, symptomatic (held after pain rating increased to 6/10), pt cued for max hip extension -STS x5, functional assessment -LAQ 1 set of 10 reps     PATIENT EDUCATION:  Education details: Pt was educated on findings of PT evaluation, prognosis, frequency of therapy visits and rationale, attendance policy, and HEP if given.   Person educated: Patient Education method: Explanation and Handouts Education comprehension: verbalized understanding and needs further education  HOME EXERCISE PROGRAM: Access Code: [email or web address] URL:  [email or web address] Date: 02/17/2024 Prepared by: Armond Bertin  Exercises - Seated Long Arc Quad  - 1 x daily - 3 sets - 10 reps - Standing Heel Raise with Support  - 1 x daily - 3 sets - 10 reps - Sit to Stand with Arms Crossed  - 1 x daily - 3 sets - 10 reps  ASSESSMENT:  CLINICAL IMPRESSION: 03/03/24 mini progress note for insurance authorization request. Noted improvement with functional testing and strength and 7 point improvement on LEFS.   Continued with lower extremity mobility and strengthening; added in some stretching today; patient has to help her leg into a hip external rotation position for piriformis stretch but improves with repetition.   Patient will benefit from continued skilled therapy services to address deficits and promote return to optimal function.       Eval:  Patient is a 53 y.o. female who was seen today for physical therapy evaluation and treatment for right knee pain.   Patient demonstrates decreased LE strength, abnormal pain rating, and impaired gait quality. Patient also demonstrates difficulty with ambulation during today's session with decreased stride length and velocity noted. Patient also demonstrates palpable tenderness ot medial joint line and extension over pressure of R knee. Patient requires education on POC and on plan to use kinesiotherapy in the future after a couple of weeks of HEP compliance. Patient would benefit from skilled physical therapy for increased endurance with ambulation, increased LE strength, decreased RLE pain and improved balance for improved gait quality, return to higher level of function with ADLs, and  progress towards therapy goals.   OBJECTIVE IMPAIRMENTS: Abnormal gait, decreased activity tolerance, decreased balance, decreased endurance, decreased mobility, difficulty walking, decreased ROM, decreased strength, obesity, and pain.   ACTIVITY LIMITATIONS: carrying, lifting, bending, standing, squatting, stairs,  transfers, and locomotion level  PARTICIPATION LIMITATIONS: meal prep, cleaning, shopping, community activity, and yard work  PERSONAL FACTORS: Age, Fitness, Past/current experiences, Time since onset of injury/illness/exacerbation, and 1 comorbidity: diabetes  are also affecting patient's functional outcome.   REHAB POTENTIAL: Good  CLINICAL DECISION MAKING: Stable/uncomplicated  EVALUATION COMPLEXITY: Low   GOALS: Goals reviewed with patient? No  SHORT TERM GOALS: Target date: 03/09/24  Patient will demonstrate evidence of independence with individualized HEP and will report compliance for at least 3 days per week for optimized progression towards remaining therapy goals. Baseline:  Goal status: IN PROGRESS  2.  Patient will report a decrease in pain level during community ambulation by at least 2 points for improved quality of life. Baseline: see above Goal status: IN PROGRESS     LONG TERM GOALS: Target date: 03/30/24  Pt will demonstrate a an increase of at least 9 points on the LEFS for improved performance of community ambulation and ADL. Baseline: see above; 30/80 (7 point improvement) 03/03/24 Goal status: IN PROGRESS  2.  Pt will improve 2 MWT by 140 feet (and decrease pain rating) in order to demonstrate improved functional ambulatory capacity in community setting.  Baseline: see above; 03/03/24 471 ft (improved by 71 ft) Goal status: IN PROGRESS  3.  Pt will demonstrate 5 degree increase in R knee ROM (flexion and extension) in right knee, for increased mobility and maximal efficiency of gait cycle during ambulation. Baseline: see above Goal status: IN PROGRESS  4.  Pt will demonstrate at least 4/5 MMT for right lower extremity for increased strength during ADL and community ambulation. Baseline: see above Goal status: IN PROGRESS  5.  Pt will report ability to walk for extended distances, longer than 3 minutes, without pain for increased endurance and  increased quality of life. Baseline: unable to walk 2 minutes without increase in pain Goal status: IN PROGRESS    PLAN:  PT FREQUENCY: 2x/week  PT DURATION: 6 weeks  PLANNED INTERVENTIONS: 97110-Therapeutic exercises, 97530- Therapeutic activity, [id number]- Neuromuscular re-education, 97535- Self Care, 13086- Manual therapy, [phone]- Gait training, Patient/Family education, Balance training, Stair training, Taping, Joint mobilization, Cryotherapy, and Moist heat  PLAN FOR NEXT SESSION: progress SLS, BLE strengthening, and endurance training.  Progress balance exercise  1:39 PM, 03/03/24 Marvis Bakken Small Mykal Batiz MPT [facility] [place] [phone] Ph:[phone]   Managed Medicaid Authorization Request  Visit Dx Codes: M25.561, G89.29, R29.898  Functional Tool Score: 30/80  For all possible CPT codes, reference the Planned Interventions line above.     Check all conditions that are expected to impact treatment: {Conditions expected to impact treatment:None of these apply   If treatment provided at initial evaluation, no treatment charged due to lack of authorization.

## 2024-03-09 ENCOUNTER — Encounter (HOSPITAL_COMMUNITY): Payer: Self-pay

## 2024-03-09 ENCOUNTER — Ambulatory Visit (HOSPITAL_COMMUNITY)

## 2024-03-09 DIAGNOSIS — M25561 Pain in right knee: Secondary | ICD-10-CM | POA: Diagnosis not present

## 2024-03-09 DIAGNOSIS — G8929 Other chronic pain: Secondary | ICD-10-CM

## 2024-03-09 DIAGNOSIS — R29898 Other symptoms and signs involving the musculoskeletal system: Secondary | ICD-10-CM

## 2024-03-09 NOTE — Therapy (Signed)
 OUTPATIENT PHYSICAL THERAPY LOWER EXTREMITY TREATMENT/ PROGRESS NOTE Progress Note Reporting Period 02/17/2024 to 03/03/2024  See note below for Objective Data and Assessment of Progress/Goals.       Patient Name: Sherry Cole MRN: [id number] DOB:10/07/1971, 53 y.o., female Today's Date: 03/09/2024  END OF SESSION:  PT End of Session - 03/09/24 1347     Visit Number 5    Number of Visits 12    Date for PT Re-Evaluation 03/30/24    Authorization Type [place] MEDICAID HEALTHY BLUE    Authorization Time Period healthy blue approved 4 visits from 02/25/24-03/25/24; requested more auth 4/24 visit    PT Start Time 1348    PT Stop Time 1426    PT Time Calculation (min) 38 min    Activity Tolerance Patient tolerated treatment well    Behavior During Therapy WFL for tasks assessed/performed             Past Medical History:  Diagnosis Date   Asthma    Bipolar 1 disorder (HCC)    Depression    Diabetes mellitus without complication (HCC)    History of kidney stones    Hydrocephalus (HCC)    Migraine    Paranoid schizophrenia (HCC)    Personality disorder (HCC)    Psychotic disorder (HCC)    PTSD (post-traumatic stress disorder)    Schwannoma    Past Surgical History:  Procedure Laterality Date   CESAREAN SECTION     x2   TUBAL LIGATION     Patient Active Problem List   Diagnosis Date Noted   DOE (dyspnea on exertion) 12/15/2023   OAB (overactive bladder) 09/10/2023   Mixed stress and urge urinary incontinence 09/10/2023   History of kidney stones 07/21/2023   Trichomoniasis 06/01/2023   Urinary incontinence with continuous leakage 05/27/2023   Sleep disturbance 09/24/2022   Moody 09/24/2022   Hot flashes 09/24/2022   Perimenopause 09/24/2022   Cocaine use disorder (HCC) 08/29/2022   Adjustment disorder with mixed anxiety and depressed mood 08/29/2022   Substance induced mood disorder (HCC) 08/29/2022   GERD without esophagitis 02/29/2016   Umbilical hernia,  reducible 02/29/2016   Asthma 01/10/2016    PCP: Twylla Galen, MD   REFERRING PROVIDER: Twylla Galen, MD  REFERRING DIAG: right knee pain  THERAPY DIAG:  Weakness of both lower extremities  Chronic pain of right knee  Rationale for Evaluation and Treatment: Rehabilitation  ONSET DATE: 2017, pregnant with daughter is when the knee started behaving wierd  SUBJECTIVE:   SUBJECTIVE STATEMENT:  03/09/24:  Feeling good today, no reports of pain.  Stated she was able to walk a mile yesterday with no pain, was a little tired and thighs feel tight today.  Stated most difficulty with standing greater than 5 minutes, reports discomfort at pain in knee.  PN on 03/03/24: About "20%" better overall since start of therapy; Patient reports soreness today especially in her glutes from therapy yesterday.   Evals: Pt reports right knee pain starting when she was pregnant with her daughter in 2017. Pt state the knee started to act "weird" and have "given out" several times at various times and has caused her to fall forward about 5 times in the last 6 months. Pt reports pain rating of 7/10 after walking for 5 minutes before having to take seated breaks. Walking causes pain, can walk about a minute before it starts hurting.    PERTINENT HISTORY: -diagnosed with diabetes about 9 months ago -on weight loss medications PAIN:  Are you having pain? Yes: NPRS scale: 0/10 Pain location: medial hamstring area, IT band (medial side worse) Pain description: continuous sharp pain Aggravating factors: walking, stairs Relieving factors: rest, non weightbearing, keeping it bent  PRECAUTIONS: Fall  RED FLAGS: None Lost 26 pounds but on weight loss medication  WEIGHT BEARING RESTRICTIONS: No  FALLS:  Has patient fallen in last 6 months? Yes. Number of falls 5, forward right knee giving out  LIVING ENVIRONMENT: Lives with: lives with their family and lives with their partner Lives in: Mobile  home Stairs:  WC ramp Has following equipment at home: Single point cane  OCCUPATION: non-employed  PLOF: Independent  PATIENT GOALS: walk for a mile with no pain, go up and down stairs without fear of falling  NEXT MD VISIT: none reported  OBJECTIVE:  Note: Objective measures were completed at Evaluation unless otherwise noted.  DIAGNOSTIC FINDINGS: none of RLE  PATIENT SURVEYS:  LEFS 23/80  COGNITION: Overall cognitive status: Within functional limits for tasks assessed     SENSATION: WFL     PALPATION: Tenderness medial joint line, anterior knee  LOWER EXTREMITY ROM:  Active ROM Right eval Left eval Right  03/09/24  Hip flexion     Hip extension     Hip abduction     Hip adduction     Hip internal rotation     Hip external rotation     Knee flexion 109, pain in right hip  115 133 pain free  Knee extension     Ankle dorsiflexion     Ankle plantarflexion     Ankle inversion     Ankle eversion      (Blank rows = not tested)  LOWER EXTREMITY MMT:  MMT Right eval Left eval Right  03/03/24 Left 03/03/24  Hip flexion 4, pain 4, pain 4+ 4+  Hip extension      Hip abduction 5 5    Hip adduction 5 5    Hip internal rotation      Hip external rotation      Knee flexion 3+, pain 4    Knee extension 3+, pain 4 4 4+  Ankle dorsiflexion 5 5    Ankle plantarflexion      Ankle inversion      Ankle eversion       (Blank rows = not tested)  LOWER EXTREMITY SPECIAL TESTS:  Knee special tests: McMurray's test: positive   FUNCTIONAL TESTS:  5 times sit to stand: 19.36, right knee feels wobbly 2 minute walk test: 400 feet, pain in the right knee 2/10  Stair ambulation: TBA next session GAIT: Distance walked: 400 feet Assistive device utilized: None Level of assistance: Complete Independence Comments: decreased velocity, especially at the end of the gait assessment, pt demonstrates somewhat symmetrical gait pattern with increased heel strike noticed on the  RLE as well as decreased stance time on RLE on occasion.  TREATMENT DATE:  03/09/24 Bike seat 11 x 5' level 3 dynamic warm up Standing: - Squat 15x front of chair - SLS Rt 16", Lt 16" - Vector stance 3x 5" - Tandem stance 1x 30" - Tandem stance on foam 2x 30" - Reciprocal pattern 7in 3RT minimal to no HR Prone: quad stretch with rope 3x 30" Supine AROM 133 degrees  03/03/24 Bike seat 11 x 5' level 2 dynamic warm up Mini progress note for insurance authorization 5 times sit to stand  11.09 2 MWT 471 ft LEFS 30/80 37.5% MMT's see above  Seated  Bilateral H/S stretch 3 x 20" Piriformis stretch 3 x 20" Standing: Calf raises on step x 10 Calf stretching at wall 3 x 20" Alternating lunges on 7" step with light UE support x 10 each 7" lateral step ups x 10 each   03/02/24 Bike seat 11 x 5' dynamic warm up Heel raises 2 x 10 Slant board 5 x 20" Squats to chair for target 2 x 10 Lateral step ups 4" 2 x 10 each Seated: 2# LAQ's 2 x 10 2# hip flexion 2 x 10 Hip vectors x 5 each   02/25/24:   Reviewed goals Educated importance of HEP compliance for maximal benefits  Stairs 7in step height alternating with no HR ascend, 1 HR descending with c/o discomfort  Supine Bridges- DC due to pain. Heelsides 2-124 degrees SLR 10x  Seated: STS 10x eccentric control no HHA  Standing:  Mini-wall squats  2sets 5 reps 3" holds Heel raises 10x3" Gastroc stretch against wall 3x 30 SLS Lt 12", Rt 20" Step up 6in 10x with 1 HHA  Bike:  5' seat 12 full revolution  02/17/2024  Evaluation: -ROM measured, Strength assessed, HEP prescribed, pt educated on prognosis, findings, and importance of HEP compliance if given.  Therapeutic Exercise: -Supine bridges 2 sets of 8 reps, 3 second holds, symptomatic (held after pain rating increased to 6/10), pt cued for max hip  extension -STS x5, functional assessment -LAQ 1 set of 10 reps     PATIENT EDUCATION:  Education details: Pt was educated on findings of PT evaluation, prognosis, frequency of therapy visits and rationale, attendance policy, and HEP if given.   Person educated: Patient Education method: Explanation and Handouts Education comprehension: verbalized understanding and needs further education  HOME EXERCISE PROGRAM: Access Code: [email or web address] URL: [email or web address] Date: 02/17/2024 Prepared by: Armond Bertin  Exercises - Seated Long Arc Quad  - 1 x daily - 3 sets - 10 reps - Standing Heel Raise with Support  - 1 x daily - 3 sets - 10 reps - Sit to Stand with Arms Crossed  - 1 x daily - 3 sets - 10 reps  03/09/24: - Prone Quadriceps Stretch with Strap  - 2 x daily - 7 x weekly - 1 sets - 3 reps - 30" hold - Squat with Chair Touch  - 2 x daily - 7 x weekly - 2 sets - 10 reps - 3" hold - Standing 3-Way Kick  - 1 x daily - 7 x weekly - 3 sets - 10 reps - Side Stepping with Resistance at Thighs and Counter Support  - 2 x daily - 7 x weekly - 1 sets - 5 reps - Standing Tandem Balance with Counter Support  - 2 x daily - 7 x weekly - 1 sets - 3 reps - 30" hold  ASSESSMENT:  CLINICAL IMPRESSION: 03/09/24:  Session focus with functional strengthening and balance training.  Pt is progressing well with improved standing tolerance, does c/o some discomfort with increased weight bearing though no pain (Rt foot behind during tandem stance).  Added balance training activities with stable stance and minimal guard required.  Progressed exercises at home with additional strengthening and balance activities added, given handout and verbalized understanding.      Eval:  Patient is a 53 y.o. female who was seen today for physical therapy evaluation and treatment for right knee pain.   Patient demonstrates decreased LE strength, abnormal pain rating, and impaired gait quality. Patient also  demonstrates difficulty with ambulation during today's session with decreased stride length and velocity noted. Patient also demonstrates palpable tenderness ot medial joint line and extension over pressure of R knee. Patient requires education on POC and on plan to use kinesiotherapy in the future after a couple of weeks of HEP compliance. Patient would benefit from skilled physical therapy for increased endurance with ambulation, increased LE strength, decreased RLE pain and improved balance for improved gait quality, return to higher level of function with ADLs, and progress towards therapy goals.   OBJECTIVE IMPAIRMENTS: Abnormal gait, decreased activity tolerance, decreased balance, decreased endurance, decreased mobility, difficulty walking, decreased ROM, decreased strength, obesity, and pain.   ACTIVITY LIMITATIONS: carrying, lifting, bending, standing, squatting, stairs, transfers, and locomotion level  PARTICIPATION LIMITATIONS: meal prep, cleaning, shopping, community activity, and yard work  PERSONAL FACTORS: Age, Fitness, Past/current experiences, Time since onset of injury/illness/exacerbation, and 1 comorbidity: diabetes  are also affecting patient's functional outcome.   REHAB POTENTIAL: Good  CLINICAL DECISION MAKING: Stable/uncomplicated  EVALUATION COMPLEXITY: Low   GOALS: Goals reviewed with patient? No  SHORT TERM GOALS: Target date: 03/09/24  Patient will demonstrate evidence of independence with individualized HEP and will report compliance for at least 3 days per week for optimized progression towards remaining therapy goals. Baseline:  Goal status: IN PROGRESS  2.  Patient will report a decrease in pain level during community ambulation by at least 2 points for improved quality of life. Baseline: see above Goal status: IN PROGRESS     LONG TERM GOALS: Target date: 03/30/24  Pt will demonstrate a an increase of at least 9 points on the LEFS for improved  performance of community ambulation and ADL. Baseline: see above; 30/80 (7 point improvement) 03/03/24 Goal status: IN PROGRESS  2.  Pt will improve 2 MWT by 140 feet (and decrease pain rating) in order to demonstrate improved functional ambulatory capacity in community setting.  Baseline: see above; 03/03/24 471 ft (improved by 71 ft) Goal status: IN PROGRESS  3.  Pt will demonstrate 5 degree increase in R knee ROM (flexion and extension) in right knee, for increased mobility and maximal efficiency of gait cycle during ambulation. Baseline: see above Goal status: IN PROGRESS  4.  Pt will demonstrate at least 4/5 MMT for right lower extremity for increased strength during ADL and community ambulation. Baseline: see above Goal status: IN PROGRESS  5.  Pt will report ability to walk for extended distances, longer than 3 minutes, without pain for increased endurance and increased quality of life. Baseline: unable to walk 2 minutes without increase in pain Goal status: IN PROGRESS    PLAN:  PT FREQUENCY: 2x/week  PT DURATION: 6 weeks  PLANNED INTERVENTIONS: 97110-Therapeutic exercises, 97530- Therapeutic activity, [id number]- Neuromuscular re-education, 97535- Self Care, [id number]- Manual therapy, [phone]- Gait training, Patient/Family education, Balance training, Stair training, Taping, Joint mobilization, Cryotherapy, and Moist heat  PLAN FOR NEXT SESSION: progress  SLS, BLE strengthening, and endurance training.  Progress balance exercise  Minor Amble, LPTA/CLT; CBIS [phone]  2:38 PM, 03/09/24

## 2024-03-10 ENCOUNTER — Encounter (HOSPITAL_COMMUNITY)

## 2024-03-14 ENCOUNTER — Ambulatory Visit (HOSPITAL_COMMUNITY): Attending: Internal Medicine | Admitting: Physical Therapy

## 2024-03-14 DIAGNOSIS — M25561 Pain in right knee: Secondary | ICD-10-CM | POA: Diagnosis present

## 2024-03-14 DIAGNOSIS — R29898 Other symptoms and signs involving the musculoskeletal system: Secondary | ICD-10-CM | POA: Insufficient documentation

## 2024-03-14 DIAGNOSIS — G8929 Other chronic pain: Secondary | ICD-10-CM | POA: Insufficient documentation

## 2024-03-14 NOTE — Therapy (Signed)
 OUTPATIENT PHYSICAL THERAPY LOWER EXTREMITY TREATMENT    Patient Name: Sherry Cole MRN: [id number] DOB:1971-01-16, 53 y.o., female Today's Date: 03/14/2024  END OF SESSION:  PT End of Session - 03/14/24 0956     Visit Number 6    Number of Visits 12    Date for PT Re-Evaluation 03/30/24    Authorization Type [place] MEDICAID HEALTHY BLUE    Authorization Time Period healthy blue approved 4 visits from 02/25/24-03/25/24; requested more auth 4/24 visit    PT Start Time 0954    PT Stop Time 1036    PT Time Calculation (min) 42 min    Activity Tolerance Patient tolerated treatment well    Behavior During Therapy [facility] for tasks assessed/performed             Past Medical History:  Diagnosis Date   Asthma    Bipolar 1 disorder (HCC)    Depression    Diabetes mellitus without complication (HCC)    History of kidney stones    Hydrocephalus (HCC)    Migraine    Paranoid schizophrenia (HCC)    Personality disorder (HCC)    Psychotic disorder (HCC)    PTSD (post-traumatic stress disorder)    Schwannoma    Past Surgical History:  Procedure Laterality Date   CESAREAN SECTION     x2   TUBAL LIGATION     Patient Active Problem List   Diagnosis Date Noted   DOE (dyspnea on exertion) 12/15/2023   OAB (overactive bladder) 09/10/2023   Mixed stress and urge urinary incontinence 09/10/2023   History of kidney stones 07/21/2023   Trichomoniasis 06/01/2023   Urinary incontinence with continuous leakage 05/27/2023   Sleep disturbance 09/24/2022   Moody 09/24/2022   Hot flashes 09/24/2022   Perimenopause 09/24/2022   Cocaine use disorder (HCC) 08/29/2022   Adjustment disorder with mixed anxiety and depressed mood 08/29/2022   Substance induced mood disorder (HCC) 08/29/2022   GERD without esophagitis 02/29/2016   Umbilical hernia, reducible 02/29/2016   Asthma 01/10/2016    PCP: Twylla Galen, MD   REFERRING PROVIDER: Twylla Galen, MD  REFERRING DIAG: right knee  pain  THERAPY DIAG:  Weakness of both lower extremities  Chronic pain of right knee  Rationale for Evaluation and Treatment: Rehabilitation  ONSET DATE: 2017, pregnant with daughter is when the knee started behaving wierd  SUBJECTIVE:   SUBJECTIVE STATEMENT:  03/14/24:  Pt reports having the usual pain today.   PN on 03/03/24: About "20%" better overall since start of therapy; Patient reports soreness today especially in her glutes from therapy yesterday.   Evals: Pt reports right knee pain starting when she was pregnant with her daughter in 2017. Pt state the knee started to act "weird" and have "given out" several times at various times and has caused her to fall forward about 5 times in the last 6 months. Pt reports pain rating of 7/10 after walking for 5 minutes before having to take seated breaks. Walking causes pain, can walk about a minute before it starts hurting.    PERTINENT HISTORY: -diagnosed with diabetes about 9 months ago -on weight loss medications PAIN:  Are you having pain? Yes: NPRS scale: 0/10 Pain location: medial hamstring area, IT band (medial side worse) Pain description: continuous sharp pain Aggravating factors: walking, stairs Relieving factors: rest, non weightbearing, keeping it bent  PRECAUTIONS: Fall  RED FLAGS: None Lost 26 pounds but on weight loss medication  WEIGHT BEARING RESTRICTIONS: No  FALLS:  Has  patient fallen in last 6 months? Yes. Number of falls 5, forward right knee giving out  LIVING ENVIRONMENT: Lives with: lives with their family and lives with their partner Lives in: Mobile home Stairs:  WC ramp Has following equipment at home: Single point cane  OCCUPATION: non-employed  PLOF: Independent  PATIENT GOALS: walk for a mile with no pain, go up and down stairs without fear of falling  NEXT MD VISIT: none reported  OBJECTIVE:  Note: Objective measures were completed at Evaluation unless otherwise noted.  DIAGNOSTIC  FINDINGS: none of RLE  PATIENT SURVEYS:  LEFS 23/80  COGNITION: Overall cognitive status: Within functional limits for tasks assessed     SENSATION: WFL     PALPATION: Tenderness medial joint line, anterior knee  LOWER EXTREMITY ROM:  Active ROM Right eval Left eval Right  03/09/24  Hip flexion     Hip extension     Hip abduction     Hip adduction     Hip internal rotation     Hip external rotation     Knee flexion 109, pain in right hip  115 133 pain free  Knee extension     Ankle dorsiflexion     Ankle plantarflexion     Ankle inversion     Ankle eversion      (Blank rows = not tested)  LOWER EXTREMITY MMT:  MMT Right eval Left eval Right  03/03/24 Left 03/03/24  Hip flexion 4, pain 4, pain 4+ 4+  Hip extension      Hip abduction 5 5    Hip adduction 5 5    Hip internal rotation      Hip external rotation      Knee flexion 3+, pain 4    Knee extension 3+, pain 4 4 4+  Ankle dorsiflexion 5 5    Ankle plantarflexion      Ankle inversion      Ankle eversion       (Blank rows = not tested)  LOWER EXTREMITY SPECIAL TESTS:  Knee special tests: McMurray's test: positive   FUNCTIONAL TESTS:  5 times sit to stand: 19.36, right knee feels wobbly 2 minute walk test: 400 feet, pain in the right knee 2/10  Stair ambulation: TBA next session GAIT: Distance walked: 400 feet Assistive device utilized: None Level of assistance: Complete Independence Comments: decreased velocity, especially at the end of the gait assessment, pt demonstrates somewhat symmetrical gait pattern with increased heel strike noticed on the RLE as well as decreased stance time on RLE on occasion.                                                                                                                                TREATMENT DATE:  03/09/24 Bike seat 11 x 5' level 3 dynamic warm up Standing: minimal UE assist Squat 15x front of chair Lunges onto 6" step no UE 2X10 each  LE Forward  step ups with 1 HR 6" step X10 each Lateral step ups 1 HR 6" step X10 each Vectors 2 sets of 5 reps, 3" holds each 1 UE assist SLS each 30" no difficulty SLS on blue foam 2 trials Rt:15"  Lt:16"  max Tandem stance each LE lead no UE    03/09/24 Bike seat 11 x 5' level 3 dynamic warm up Standing: - Squat 15x front of chair - SLS Rt 16", Lt 16" - Vector stance 3x 5" - Tandem stance 1x 30" - Tandem stance on foam 2x 30" - Reciprocal pattern 7in 3RT minimal to no HR Prone: quad stretch with rope 3x 30" Supine AROM 133 degrees  03/03/24 Bike seat 11 x 5' level 2 dynamic warm up Mini progress note for insurance authorization 5 times sit to stand  11.09 2 MWT 471 ft LEFS 30/80 37.5% MMT's see above  Seated  Bilateral H/S stretch 3 x 20" Piriformis stretch 3 x 20" Standing: Calf raises on step x 10 Calf stretching at wall 3 x 20" Alternating lunges on 7" step with light UE support x 10 each 7" lateral step ups x 10 each   03/02/24 Bike seat 11 x 5' dynamic warm up Heel raises 2 x 10 Slant board 5 x 20" Squats to chair for target 2 x 10 Lateral step ups 4" 2 x 10 each Seated: 2# LAQ's 2 x 10 2# hip flexion 2 x 10 Hip vectors x 5 each   02/25/24:   Reviewed goals Educated importance of HEP compliance for maximal benefits  Stairs 7in step height alternating with no HR ascend, 1 HR descending with c/o discomfort  Supine Bridges- DC due to pain. Heelsides 2-124 degrees SLR 10x  Seated: STS 10x eccentric control no HHA  Standing:  Mini-wall squats  2sets 5 reps 3" holds Heel raises 10x3" Gastroc stretch against wall 3x 30 SLS Lt 12", Rt 20" Step up 6in 10x with 1 HHA  Bike:  5' seat 12 full revolution  02/17/2024  Evaluation: -ROM measured, Strength assessed, HEP prescribed, pt educated on prognosis, findings, and importance of HEP compliance if given.  Therapeutic Exercise: -Supine bridges 2 sets of 8 reps, 3 second holds, symptomatic (held after  pain rating increased to 6/10), pt cued for max hip extension -STS x5, functional assessment -LAQ 1 set of 10 reps     PATIENT EDUCATION:  Education details: Pt was educated on findings of PT evaluation, prognosis, frequency of therapy visits and rationale, attendance policy, and HEP if given.   Person educated: Patient Education method: Explanation and Handouts Education comprehension: verbalized understanding and needs further education  HOME EXERCISE PROGRAM: Access Code: [email or web address] URL: [email or web address] Date: 02/17/2024 Prepared by: Armond Bertin  Exercises - Seated Long Arc Quad  - 1 x daily - 3 sets - 10 reps - Standing Heel Raise with Support  - 1 x daily - 3 sets - 10 reps - Sit to Stand with Arms Crossed  - 1 x daily - 3 sets - 10 reps  03/09/24: - Prone Quadriceps Stretch with Strap  - 2 x daily - 7 x weekly - 1 sets - 3 reps - 30" hold - Squat with Chair Touch  - 2 x daily - 7 x weekly - 2 sets - 10 reps - 3" hold - Standing 3-Way Kick  - 1 x daily - 7 x weekly - 3 sets - 10 reps - Side Stepping with Resistance at [employer] and Counter Support  -  2 x daily - 7 x weekly - 1 sets - 5 reps - Standing Tandem Balance with Counter Support  - 2 x daily - 7 x weekly - 1 sets - 3 reps - 30" hold  ASSESSMENT:  CLINICAL IMPRESSION: 03/14/24:  continued  focus on functional strengthening and balance training.  Pt able to complete SLS on solid surface without challenge>30 minutes.  Increased difficulty with added foam surface.  This was much more challenging for pt with max ability of 15" without UE assist.  Added sets/reps where able . Pt does have poor activity tolerance requiring frequent rest breaks.  Overall, pt is progressing well.  Encouraged to begin ambulation/more activity at home. No complaints of pain during session today.     Eval:  Patient is a 53 y.o. female who was seen today for physical therapy evaluation and treatment for right knee pain.   Patient  demonstrates decreased LE strength, abnormal pain rating, and impaired gait quality. Patient also demonstrates difficulty with ambulation during today's session with decreased stride length and velocity noted. Patient also demonstrates palpable tenderness ot medial joint line and extension over pressure of R knee. Patient requires education on POC and on plan to use kinesiotherapy in the future after a couple of weeks of HEP compliance. Patient would benefit from skilled physical therapy for increased endurance with ambulation, increased LE strength, decreased RLE pain and improved balance for improved gait quality, return to higher level of function with ADLs, and progress towards therapy goals.   OBJECTIVE IMPAIRMENTS: Abnormal gait, decreased activity tolerance, decreased balance, decreased endurance, decreased mobility, difficulty walking, decreased ROM, decreased strength, obesity, and pain.   ACTIVITY LIMITATIONS: carrying, lifting, bending, standing, squatting, stairs, transfers, and locomotion level  PARTICIPATION LIMITATIONS: meal prep, cleaning, shopping, community activity, and yard work  PERSONAL FACTORS: Age, Fitness, Past/current experiences, Time since onset of injury/illness/exacerbation, and 1 comorbidity: diabetes  are also affecting patient's functional outcome.   REHAB POTENTIAL: Good  CLINICAL DECISION MAKING: Stable/uncomplicated  EVALUATION COMPLEXITY: Low   GOALS: Goals reviewed with patient? No  SHORT TERM GOALS: Target date: 03/09/24  Patient will demonstrate evidence of independence with individualized HEP and will report compliance for at least 3 days per week for optimized progression towards remaining therapy goals. Baseline:  Goal status: IN PROGRESS  2.  Patient will report a decrease in pain level during community ambulation by at least 2 points for improved quality of life. Baseline: see above Goal status: IN PROGRESS     LONG TERM GOALS: Target  date: 03/30/24  Pt will demonstrate a an increase of at least 9 points on the LEFS for improved performance of community ambulation and ADL. Baseline: see above; 30/80 (7 point improvement) 03/03/24 Goal status: IN PROGRESS  2.  Pt will improve 2 MWT by 140 feet (and decrease pain rating) in order to demonstrate improved functional ambulatory capacity in community setting.  Baseline: see above; 03/03/24 471 ft (improved by 71 ft) Goal status: IN PROGRESS  3.  Pt will demonstrate 5 degree increase in R knee ROM (flexion and extension) in right knee, for increased mobility and maximal efficiency of gait cycle during ambulation. Baseline: see above Goal status: IN PROGRESS  4.  Pt will demonstrate at least 4/5 MMT for right lower extremity for increased strength during ADL and community ambulation. Baseline: see above Goal status: IN PROGRESS  5.  Pt will report ability to walk for extended distances, longer than 3 minutes, without pain for increased endurance  and increased quality of life. Baseline: unable to walk 2 minutes without increase in pain Goal status: IN PROGRESS    PLAN:  PT FREQUENCY: 2x/week  PT DURATION: 6 weeks  PLANNED INTERVENTIONS: 97110-Therapeutic exercises, 97530- Therapeutic activity, [id number]- Neuromuscular re-education, 97535- Self Care, [id number]- Manual therapy, [phone]- Gait training, Patient/Family education, Balance training, Stair training, Taping, Joint mobilization, Cryotherapy, and Moist heat  PLAN FOR NEXT SESSION: progress SLS, BLE strengthening, and endurance training.  Progress balance exercise  Lorenso Romance, PTA/CLT [facility] Ph: [phone]  2:59 PM, 03/14/24

## 2024-03-16 ENCOUNTER — Encounter (HOSPITAL_COMMUNITY): Payer: Self-pay

## 2024-03-16 ENCOUNTER — Ambulatory Visit (HOSPITAL_COMMUNITY)

## 2024-03-16 DIAGNOSIS — G8929 Other chronic pain: Secondary | ICD-10-CM

## 2024-03-16 DIAGNOSIS — R29898 Other symptoms and signs involving the musculoskeletal system: Secondary | ICD-10-CM

## 2024-03-16 NOTE — Therapy (Addendum)
 OUTPATIENT PHYSICAL THERAPY LOWER EXTREMITY TREATMENT    Patient Name: Sherry Cole MRN: [id number] DOB:[birth year], 53 y.o., female Today's Date: 03/16/2024  END OF SESSION:  PT End of Session - 03/16/24 1100     Visit Number 8    Number of Visits 12    Date for PT Re-Evaluation 03/30/24    Authorization Type [place] MEDICAID HEALTHY BLUE    Authorization Time Period healthy blue approved 4 visits from 02/25/24-03/25/24; requested more auth 4/24 visit    Authorization - Visit Number 4    Authorization - Number of Visits 4    Progress Note Due on Visit 4    PT Start Time 1019    PT Stop Time 1100    PT Time Calculation (min) 41 min    Activity Tolerance Patient tolerated treatment well    Behavior During Therapy WFL for tasks assessed/performed              Past Medical History:  Diagnosis Date   Asthma    Bipolar 1 disorder (HCC)    Depression    Diabetes mellitus without complication (HCC)    History of kidney stones    Hydrocephalus (HCC)    Migraine    Paranoid schizophrenia (HCC)    Personality disorder (HCC)    Psychotic disorder (HCC)    PTSD (post-traumatic stress disorder)    Schwannoma    Past Surgical History:  Procedure Laterality Date   CESAREAN SECTION     x2   TUBAL LIGATION     Patient Active Problem List   Diagnosis Date Noted   DOE (dyspnea on exertion) 12/15/2023   OAB (overactive bladder) 09/10/2023   Mixed stress and urge urinary incontinence 09/10/2023   History of kidney stones 07/21/2023   Trichomoniasis 06/01/2023   Urinary incontinence with continuous leakage 05/27/2023   Sleep disturbance 09/24/2022   Moody 09/24/2022   Hot flashes 09/24/2022   Perimenopause 09/24/2022   Cocaine use disorder (HCC) 08/29/2022   Adjustment disorder with mixed anxiety and depressed mood 08/29/2022   Substance induced mood disorder (HCC) 08/29/2022   GERD without esophagitis 02/29/2016   Umbilical hernia, reducible 02/29/2016   Asthma  01/10/2016    PCP: Twylla Galen, MD   REFERRING PROVIDER: Twylla Galen, MD  REFERRING DIAG: right knee pain  THERAPY DIAG:  Weakness of both lower extremities  Chronic pain of right knee  Rationale for Evaluation and Treatment: Rehabilitation  ONSET DATE: 2017, pregnant with daughter is when the knee started behaving wierd  SUBJECTIVE:   SUBJECTIVE STATEMENT:  Pt reports 1/10 pain in right knee. Pt states she absolutely feels therapy is helping her.  PN on 03/03/24: About "20%" better overall since start of therapy; Patient reports soreness today especially in her glutes from therapy yesterday.   Evals: Pt reports right knee pain starting when she was pregnant with her daughter in 2017. Pt state the knee started to act "weird" and have "given out" several times at various times and has caused her to fall forward about 5 times in the last 6 months. Pt reports pain rating of 7/10 after walking for 5 minutes before having to take seated breaks. Walking causes pain, can walk about a minute before it starts hurting.    PERTINENT HISTORY: -diagnosed with diabetes about 9 months ago -on weight loss medications PAIN:  Are you having pain? Yes: NPRS scale: 0/10 Pain location: medial hamstring area, IT band (medial side worse) Pain description: continuous sharp pain Aggravating factors: walking,  stairs Relieving factors: rest, non weightbearing, keeping it bent  PRECAUTIONS: Fall  RED FLAGS: None Lost 26 pounds but on weight loss medication  WEIGHT BEARING RESTRICTIONS: No  FALLS:  Has patient fallen in last 6 months? Yes. Number of falls 5, forward right knee giving out  LIVING ENVIRONMENT: Lives with: lives with their family and lives with their partner Lives in: Mobile home Stairs:  WC ramp Has following equipment at home: Single point cane  OCCUPATION: non-employed  PLOF: Independent  PATIENT GOALS: walk for a mile with no pain, go up and down stairs without  fear of falling  NEXT MD VISIT: none reported  OBJECTIVE:  Note: Objective measures were completed at Evaluation unless otherwise noted.  DIAGNOSTIC FINDINGS: none of RLE  PATIENT SURVEYS:  LEFS 23/80  COGNITION: Overall cognitive status: Within functional limits for tasks assessed     SENSATION: WFL     PALPATION: Tenderness medial joint line, anterior knee  LOWER EXTREMITY ROM:  Active ROM Right eval Left eval Right  03/09/24  Hip flexion     Hip extension     Hip abduction     Hip adduction     Hip internal rotation     Hip external rotation     Knee flexion 109, pain in right hip  115 133 pain free  Knee extension     Ankle dorsiflexion     Ankle plantarflexion     Ankle inversion     Ankle eversion      (Blank rows = not tested)  LOWER EXTREMITY MMT:  MMT Right eval Left eval Right  03/03/24 Left 03/03/24  Hip flexion 4, pain 4, pain 4+ 4+  Hip extension      Hip abduction 5 5    Hip adduction 5 5    Hip internal rotation      Hip external rotation      Knee flexion 3+, pain 4    Knee extension 3+, pain 4 4 4+  Ankle dorsiflexion 5 5    Ankle plantarflexion      Ankle inversion      Ankle eversion       (Blank rows = not tested)  LOWER EXTREMITY SPECIAL TESTS:  Knee special tests: McMurray's test: positive   FUNCTIONAL TESTS:  5 times sit to stand: 19.36, right knee feels wobbly 2 minute walk test: 400 feet, pain in the right knee 2/10  Stair ambulation: TBA next session GAIT: Distance walked: 400 feet Assistive device utilized: None Level of assistance: Complete Independence Comments: decreased velocity, especially at the end of the gait assessment, pt demonstrates somewhat symmetrical gait pattern with increased heel strike noticed on the RLE as well as decreased stance time on RLE on occasion.                                                                                                                                TREATMENT  DATE:  03/16/2024  Therapeutic Exercise: -Bike, level 2 resistance, 5 minutes -Tidal tank walks/marches, 30 feet per lap, 2 laps each variation -Lateral step up and overs, 2 set of 5 reps, 8 inch step, no UE support needed -Standing reactive 3 way hip 2 sets 3 reps, bilaterally, pt cued for upright trunk and maintaining of neutral spine -Lateral stepping  with mini squat, 3 laps 20 feet per lap, with GTB around ankles, pt cued for upright posture -Gastroc stretch, 2 set of 30 seconds -Hamstring stretch, 1 set of 30 seconds   Therapeutic Activity: -Sit to stands with tidal tank, 2 sets of 8 reps, pt cued for core activation -Lateral step up and overs, 2 sets of 8 reps, 6 inch step    03/09/24 Bike seat 11 x 5' level 3 dynamic warm up Standing: minimal UE assist Squat 15x front of chair Lunges onto 6" step no UE 2X10 each LE Forward step ups with 1 HR 6" step X10 each Lateral step ups 1 HR 6" step X10 each Vectors 2 sets of 5 reps, 3" holds each 1 UE assist SLS each 30" no difficulty SLS on blue foam 2 trials Rt:15"  Lt:16"  max Tandem stance each LE lead no UE    03/09/24 Bike seat 11 x 5' level 3 dynamic warm up Standing: - Squat 15x front of chair - SLS Rt 16", Lt 16" - Vector stance 3x 5" - Tandem stance 1x 30" - Tandem stance on foam 2x 30" - Reciprocal pattern 7in 3RT minimal to no HR Prone: quad stretch with rope 3x 30" Supine AROM 133 degrees     PATIENT EDUCATION:  Education details: Pt was educated on findings of PT evaluation, prognosis, frequency of therapy visits and rationale, attendance policy, and HEP if given.   Person educated: Patient Education method: Explanation and Handouts Education comprehension: verbalized understanding and needs further education  HOME EXERCISE PROGRAM: Access Code: [email or web address] URL: [email or web address] Date: 02/17/2024 Prepared by: Armond Bertin  Exercises - Seated Long Arc Quad  - 1 x daily - 3 sets - 10 reps -  Standing Heel Raise with Support  - 1 x daily - 3 sets - 10 reps - Sit to Stand with Arms Crossed  - 1 x daily - 3 sets - 10 reps  03/09/24: - Prone Quadriceps Stretch with Strap  - 2 x daily - 7 x weekly - 1 sets - 3 reps - 30" hold - Squat with Chair Touch  - 2 x daily - 7 x weekly - 2 sets - 10 reps - 3" hold - Standing 3-Way Kick  - 1 x daily - 7 x weekly - 3 sets - 10 reps - Side Stepping with Resistance at Thighs and Counter Support  - 2 x daily - 7 x weekly - 1 sets - 5 reps - Standing Tandem Balance with Counter Support  - 2 x daily - 7 x weekly - 1 sets - 3 reps - 30" hold  ASSESSMENT:  CLINICAL IMPRESSION: Patient continues to demonstrate improved LE strength and ROM, decreased knee pain and improved gait quality and balance. Patient also demonstrates great effort throughout session with visibile perspiration noted.  Patient able to progress dynamic balance and core activation exercises today with increased step height with lateral step overs and progressed balance activities with tidal tank, good performance with verbal cueing required for proper form. Patient would continue to benefit from additional 3 weeks 2 visits per week of skilled physical therapy  for decreased R knee pain, increased endurance with ambulation, increased LE strength, and improved balance for improved quality of life, improved independence with performance of ADLs and continued progress towards therapy goals.     Eval:  Patient is a 53 y.o. female who was seen today for physical therapy evaluation and treatment for right knee pain.   Patient demonstrates decreased LE strength, abnormal pain rating, and impaired gait quality. Patient also demonstrates difficulty with ambulation during today's session with decreased stride length and velocity noted. Patient also demonstrates palpable tenderness ot medial joint line and extension over pressure of R knee. Patient requires education on POC and on plan to use  kinesiotherapy in the future after a couple of weeks of HEP compliance. Patient would benefit from skilled physical therapy for increased endurance with ambulation, increased LE strength, decreased RLE pain and improved balance for improved gait quality, return to higher level of function with ADLs, and progress towards therapy goals.   OBJECTIVE IMPAIRMENTS: Abnormal gait, decreased activity tolerance, decreased balance, decreased endurance, decreased mobility, difficulty walking, decreased ROM, decreased strength, obesity, and pain.   ACTIVITY LIMITATIONS: carrying, lifting, bending, standing, squatting, stairs, transfers, and locomotion level  PARTICIPATION LIMITATIONS: meal prep, cleaning, shopping, community activity, and yard work  PERSONAL FACTORS: Age, Fitness, Past/current experiences, Time since onset of injury/illness/exacerbation, and 1 comorbidity: diabetes  are also affecting patient's functional outcome.   REHAB POTENTIAL: Good  CLINICAL DECISION MAKING: Stable/uncomplicated  EVALUATION COMPLEXITY: Low   GOALS: Goals reviewed with patient? No  SHORT TERM GOALS: Target date: 03/09/24  Patient will demonstrate evidence of independence with individualized HEP and will report compliance for at least 3 days per week for optimized progression towards remaining therapy goals. Baseline:  Goal status: IN PROGRESS  2.  Patient will report a decrease in pain level during community ambulation by at least 2 points for improved quality of life. Baseline: see above Goal status: IN PROGRESS     LONG TERM GOALS: Target date: 03/30/24  Pt will demonstrate a an increase of at least 9 points on the LEFS for improved performance of community ambulation and ADL. Baseline: see above; 30/80 (7 point improvement) 03/03/24 Goal status: IN PROGRESS  2.  Pt will improve 2 MWT by 140 feet (and decrease pain rating) in order to demonstrate improved functional ambulatory capacity in community  setting.  Baseline: see above; 03/03/24 471 ft (improved by 71 ft) Goal status: IN PROGRESS  3.  Pt will demonstrate 5 degree increase in R knee ROM (flexion and extension) in right knee, for increased mobility and maximal efficiency of gait cycle during ambulation. Baseline: see above Goal status: IN PROGRESS  4.  Pt will demonstrate at least 4/5 MMT for right lower extremity for increased strength during ADL and community ambulation. Baseline: see above Goal status: IN PROGRESS  5.  Pt will report ability to walk for extended distances, longer than 3 minutes, without pain for increased endurance and increased quality of life. Baseline: unable to walk 2 minutes without increase in pain Goal status: IN PROGRESS    PLAN:  PT FREQUENCY: 2x/week  PT DURATION: 6 weeks  PLANNED INTERVENTIONS: 97110-Therapeutic exercises, 97530- Therapeutic activity, [id number]- Neuromuscular re-education, 97535- Self Care, 60454- Manual therapy, [phone]- Gait training, Patient/Family education, Balance training, Stair training, Taping, Joint mobilization, Cryotherapy, and Moist heat  PLAN FOR NEXT SESSION: progress SLS, BLE strengthening, and endurance training.  Progress balance exercise  Armond Bertin, PT, DPT [facility] Office: [phone] 11:03  AM, 03/16/24     Managed Medicaid Authorization Request  Visit Dx Codes: N82.956 ; O13.086, G89.29   Functional Tool Score: LEFS 23/80  For all possible CPT codes, reference the Planned Interventions line above.     Check all conditions that are expected to impact treatment: {Conditions expected to impact treatment:None of these apply   If treatment provided at initial evaluation, no treatment charged due to lack of authorization.

## 2024-03-23 ENCOUNTER — Encounter (HOSPITAL_COMMUNITY): Payer: Self-pay

## 2024-03-23 ENCOUNTER — Ambulatory Visit (HOSPITAL_COMMUNITY)

## 2024-03-23 DIAGNOSIS — G8929 Other chronic pain: Secondary | ICD-10-CM

## 2024-03-23 DIAGNOSIS — R29898 Other symptoms and signs involving the musculoskeletal system: Secondary | ICD-10-CM | POA: Diagnosis not present

## 2024-03-23 NOTE — Therapy (Signed)
 OUTPATIENT PHYSICAL THERAPY LOWER EXTREMITY TREATMENT    Patient Name: Coretta Trama MRN: [id number] DOB:11/17/1970, 53 y.o., female Today's Date: 03/23/2024  END OF SESSION:  PT End of Session - 03/23/24 1052     Visit Number 9    Number of Visits 12    Date for PT Re-Evaluation 03/30/24    Authorization Type [place] MEDICAID HEALTHY BLUE    Authorization Time Period healthy blue approved 4 visits from 02/25/24-03/25/24; requested more auth 4/24 visit    Authorization - Visit Number 5    PT Start Time 1015    PT Stop Time 1059    PT Time Calculation (min) 44 min    Activity Tolerance Patient tolerated treatment well;Patient limited by pain    Behavior During Therapy [facility] for tasks assessed/performed               Past Medical History:  Diagnosis Date   Asthma    Bipolar 1 disorder (HCC)    Depression    Diabetes mellitus without complication (HCC)    History of kidney stones    Hydrocephalus (HCC)    Migraine    Paranoid schizophrenia (HCC)    Personality disorder (HCC)    Psychotic disorder (HCC)    PTSD (post-traumatic stress disorder)    Schwannoma    Past Surgical History:  Procedure Laterality Date   CESAREAN SECTION     x2   TUBAL LIGATION     Patient Active Problem List   Diagnosis Date Noted   DOE (dyspnea on exertion) 12/15/2023   OAB (overactive bladder) 09/10/2023   Mixed stress and urge urinary incontinence 09/10/2023   History of kidney stones 07/21/2023   Trichomoniasis 06/01/2023   Urinary incontinence with continuous leakage 05/27/2023   Sleep disturbance 09/24/2022   Moody 09/24/2022   Hot flashes 09/24/2022   Perimenopause 09/24/2022   Cocaine use disorder (HCC) 08/29/2022   Adjustment disorder with mixed anxiety and depressed mood 08/29/2022   Substance induced mood disorder (HCC) 08/29/2022   GERD without esophagitis 02/29/2016   Umbilical hernia, reducible 02/29/2016   Asthma 01/10/2016    PCP: Twylla Galen,  MD   REFERRING PROVIDER: Twylla Galen, MD  REFERRING DIAG: right knee pain  THERAPY DIAG:  Weakness of both lower extremities  Chronic pain of right knee  Rationale for Evaluation and Treatment: Rehabilitation  ONSET DATE: 2017, pregnant with daughter is when the knee started behaving wierd  SUBJECTIVE:   SUBJECTIVE STATEMENT:  Pt reports 5/10 pain today, states it has gotten better since yesterday. It has been swelling for the past few days, had to ice it yesterday at 10am.   PN on 03/03/24: About "20%" better overall since start of therapy; Patient reports soreness today especially in her glutes from therapy yesterday.   Evals: Pt reports right knee pain starting when she was pregnant with her daughter in 2017. Pt state the knee started to act "weird" and have "given out" several times at various times and has caused her to fall forward about 5 times in the last 6 months. Pt reports pain rating of 7/10 after walking for 5 minutes before having to take seated breaks. Walking causes pain, can walk about a minute before it starts hurting.    PERTINENT HISTORY: -diagnosed with diabetes about 9 months ago -on weight loss medications PAIN:  Are you having pain? Yes: NPRS scale: 0/10 Pain location: medial hamstring area, IT band (medial side worse) Pain description: continuous sharp pain Aggravating factors: walking, stairs  Relieving factors: rest, non weightbearing, keeping it bent  PRECAUTIONS: Fall  RED FLAGS: None Lost 26 pounds but on weight loss medication  WEIGHT BEARING RESTRICTIONS: No  FALLS:  Has patient fallen in last 6 months? Yes. Number of falls 5, forward right knee giving out  LIVING ENVIRONMENT: Lives with: lives with their family and lives with their partner Lives in: Mobile home Stairs: WC ramp Has following equipment at home: Single point cane  OCCUPATION: non-employed  PLOF: Independent  PATIENT GOALS: walk for a mile with no pain, go up and  down stairs without fear of falling  NEXT MD VISIT: none reported  OBJECTIVE:  Note: Objective measures were completed at Evaluation unless otherwise noted.  DIAGNOSTIC FINDINGS: none of RLE  PATIENT SURVEYS:  LEFS 23/80  COGNITION: Overall cognitive status: Within functional limits for tasks assessed     SENSATION: WFL     PALPATION: Tenderness medial joint line, anterior knee  LOWER EXTREMITY ROM:  Active ROM Right eval Left eval Right  03/09/24  Hip flexion     Hip extension     Hip abduction     Hip adduction     Hip internal rotation     Hip external rotation     Knee flexion 109, pain in right hip  115 133 pain free  Knee extension     Ankle dorsiflexion     Ankle plantarflexion     Ankle inversion     Ankle eversion      (Blank rows = not tested)  LOWER EXTREMITY MMT:  MMT Right eval Left eval Right  03/03/24 Left 03/03/24  Hip flexion 4, pain 4, pain 4+ 4+  Hip extension      Hip abduction 5 5    Hip adduction 5 5    Hip internal rotation      Hip external rotation      Knee flexion 3+, pain 4    Knee extension 3+, pain 4 4 4+  Ankle dorsiflexion 5 5    Ankle plantarflexion      Ankle inversion      Ankle eversion       (Blank rows = not tested)  LOWER EXTREMITY SPECIAL TESTS:  Knee special tests: McMurray's test: positive   FUNCTIONAL TESTS:  5 times sit to stand: 19.36, right knee feels wobbly 2 minute walk test: 400 feet, pain in the right knee 2/10  Stair ambulation: TBA next session GAIT: Distance walked: 400 feet Assistive device utilized: None Level of assistance: Complete Independence Comments: decreased velocity, especially at the end of the gait assessment, pt demonstrates somewhat symmetrical gait pattern with increased heel strike noticed on the RLE as well as decreased stance time on RLE on occasion.                                                                                                                                 TREATMENT DATE:  03/23/2024  Therapeutic Exercise: -Bike, level 3 resistance, 5 minutes -Tidal tank walks/marches, 30 feet per lap, 2 laps each variation -TKEs on 4 inch step, 1 set of 8 reps bilaterally, pt requiring increased UE support today due to instability and pain -Standing 3 way hip 1 sets 5 reps, bilaterally, pt cued for upright trunk and maintaining of neutral spine -Lateral stepping  (with mini squat last lap only), 3 laps 20 feet per lap, with GTB around ankles, pt cued for upright posture -Monster walks, GTB at ankles, pt cued for eccentric control, 3 laps, 20 feet per lap -Power walks with high knee, 2 laps, 60 feet per lap, pt requires 2 standing rest breaks    Therapeutic Activity: -Sit to stands with tidal tank sphere, 2 sets of 7 reps, pt cued for core activation and lateral movement of sphere    03/16/2024  Therapeutic Exercise: -Bike, level 2 resistance, 5 minutes -Tidal tank walks/marches, 30 feet per lap, 2 laps each variation -Lateral step up and overs, 2 set of 5 reps, 8 inch step, no UE support needed -Standing reactive 3 way hip 2 sets 3 reps, bilaterally, pt cued for upright trunk and maintaining of neutral spine -Lateral stepping  with mini squat, 3 laps 20 feet per lap, with GTB around ankles, pt cued for upright posture -Gastroc stretch, 2 set of 30 seconds -Hamstring stretch, 1 set of 30 seconds   Therapeutic Activity: -Sit to stands with tidal tank, 2 sets of 8 reps, pt cued for core activation -Lateral step up and overs, 2 sets of 8 reps, 6 inch step    03/09/24 Bike seat 11 x 5' level 3 dynamic warm up Standing: minimal UE assist Squat 15x front of chair Lunges onto 6" step no UE 2X10 each LE Forward step ups with 1 HR 6" step X10 each Lateral step ups 1 HR 6" step X10 each Vectors 2 sets of 5 reps, 3" holds each 1 UE assist SLS each 30" no difficulty SLS on blue foam 2 trials Rt:15"  Lt:16"  max Tandem stance each LE lead no UE          PATIENT EDUCATION:  Education details: Pt was educated on findings of PT evaluation, prognosis, frequency of therapy visits and rationale, attendance policy, and HEP if given.   Person educated: Patient Education method: Explanation and Handouts Education comprehension: verbalized understanding and needs further education  HOME EXERCISE PROGRAM: Access Code: [email or web address] URL: [email or web address] Date: 02/17/2024 Prepared by: Armond Bertin  Exercises - Seated Long Arc Quad  - 1 x daily - 3 sets - 10 reps - Standing Heel Raise with Support  - 1 x daily - 3 sets - 10 reps - Sit to Stand with Arms Crossed  - 1 x daily - 3 sets - 10 reps  03/09/24: - Prone Quadriceps Stretch with Strap  - 2 x daily - 7 x weekly - 1 sets - 3 reps - 30" hold - Squat with Chair Touch  - 2 x daily - 7 x weekly - 2 sets - 10 reps - 3" hold - Standing 3-Way Kick  - 1 x daily - 7 x weekly - 3 sets - 10 reps - Side Stepping with Resistance at Thighs and Counter Support  - 2 x daily - 7 x weekly - 1 sets - 5 reps - Standing Tandem Balance with Counter Support  - 2 x daily - 7 x weekly - 1 sets - 3 reps - 30" hold  ASSESSMENT:  CLINICAL IMPRESSION: Patient continues to demonstrate right knee pain, decreased LE strength, pain free ROM of right knee, decreased gait quality and balance. Patient also demonstrates decreased endurance with aerobic based exercise during today's session with increased need for rest breaks, knee pain also increased could be contributing factor. Patient able to progress dynamic balance and core activation exercises today with monster walks and STS variation, good performance with verbal cueing. Patient would continue to benefit from skilled physical therapy for increased endurance with ambulation, increased LE strength, decreased pain, and improved balance for improved quality of life, improved community ambulation and continued progress towards therapy goals.      Eval:   Patient is a 53 y.o. female who was seen today for physical therapy evaluation and treatment for right knee pain.   Patient demonstrates decreased LE strength, abnormal pain rating, and impaired gait quality. Patient also demonstrates difficulty with ambulation during today's session with decreased stride length and velocity noted. Patient also demonstrates palpable tenderness ot medial joint line and extension over pressure of R knee. Patient requires education on POC and on plan to use kinesiotherapy in the future after a couple of weeks of HEP compliance. Patient would benefit from skilled physical therapy for increased endurance with ambulation, increased LE strength, decreased RLE pain and improved balance for improved gait quality, return to higher level of function with ADLs, and progress towards therapy goals.   OBJECTIVE IMPAIRMENTS: Abnormal gait, decreased activity tolerance, decreased balance, decreased endurance, decreased mobility, difficulty walking, decreased ROM, decreased strength, obesity, and pain.   ACTIVITY LIMITATIONS: carrying, lifting, bending, standing, squatting, stairs, transfers, and locomotion level  PARTICIPATION LIMITATIONS: meal prep, cleaning, shopping, community activity, and yard work  PERSONAL FACTORS: Age, Fitness, Past/current experiences, Time since onset of injury/illness/exacerbation, and 1 comorbidity: diabetes are also affecting patient's functional outcome.   REHAB POTENTIAL: Good  CLINICAL DECISION MAKING: Stable/uncomplicated  EVALUATION COMPLEXITY: Low   GOALS: Goals reviewed with patient? No  SHORT TERM GOALS: Target date: 03/09/24  Patient will demonstrate evidence of independence with individualized HEP and will report compliance for at least 3 days per week for optimized progression towards remaining therapy goals. Baseline:  Goal status: IN PROGRESS  2.  Patient will report a decrease in pain level during community ambulation by at  least 2 points for improved quality of life. Baseline: see above Goal status: IN PROGRESS     LONG TERM GOALS: Target date: 03/30/24  Pt will demonstrate a an increase of at least 9 points on the LEFS for improved performance of community ambulation and ADL. Baseline: see above; 30/80 (7 point improvement) 03/03/24 Goal status: IN PROGRESS  2.  Pt will improve 2 MWT by 140 feet (and decrease pain rating) in order to demonstrate improved functional ambulatory capacity in community setting.  Baseline: see above; 03/03/24 471 ft (improved by 71 ft) Goal status: IN PROGRESS  3.  Pt will demonstrate 5 degree increase in R knee ROM (flexion and extension) in right knee, for increased mobility and maximal efficiency of gait cycle during ambulation. Baseline: see above Goal status: IN PROGRESS  4.  Pt will demonstrate at least 4/5 MMT for right lower extremity for increased strength during ADL and community ambulation. Baseline: see above Goal status: IN PROGRESS  5.  Pt will report ability to walk for extended distances, longer than 3 minutes, without pain for increased endurance and increased quality of life. Baseline: unable to walk 2 minutes without increase in pain Goal status:  IN PROGRESS    PLAN:  PT FREQUENCY: 2x/week  PT DURATION: 6 weeks  PLANNED INTERVENTIONS: 97110-Therapeutic exercises, 97530- Therapeutic activity, [id number]- Neuromuscular re-education, 97535- Self Care, [id number]- Manual therapy, [phone]- Gait training, Patient/Family education, Balance training, Stair training, Taping, Joint mobilization, Cryotherapy, and Moist heat  PLAN FOR NEXT SESSION: progress SLS, BLE strengthening, and endurance training.  Progress balance exercise. Manual for pain management.  Charl Wellen, PT, DPT [facility] Office: [phone] 11:00 AM, 03/23/24

## 2024-03-24 ENCOUNTER — Encounter (HOSPITAL_COMMUNITY): Payer: Self-pay

## 2024-03-24 ENCOUNTER — Ambulatory Visit (HOSPITAL_COMMUNITY)

## 2024-03-24 DIAGNOSIS — R29898 Other symptoms and signs involving the musculoskeletal system: Secondary | ICD-10-CM

## 2024-03-24 DIAGNOSIS — G8929 Other chronic pain: Secondary | ICD-10-CM

## 2024-03-24 NOTE — Therapy (Signed)
 OUTPATIENT PHYSICAL THERAPY LOWER EXTREMITY TREATMENT    Patient Name: Sherry Cole MRN: [id number] DOB:[birth year], 53 y.o., female Today's Date: 03/24/2024  END OF SESSION:  PT End of Session - 03/24/24 1147     Visit Number 10    Number of Visits 12    Date for PT Re-Evaluation 03/30/24    Authorization Type [place] MEDICAID HEALTHY BLUE    Authorization Time Period healthy blue approved 4 visits from 02/25/24-03/25/24; requested more auth 4/24 visit    Authorization - Visit Number 6    PT Start Time 1103    PT Stop Time 1147    PT Time Calculation (min) 44 min    Activity Tolerance Patient tolerated treatment well;Patient limited by pain    Behavior During Therapy [place] for tasks assessed/performed                Past Medical History:  Diagnosis Date   Asthma    Bipolar 1 disorder (HCC)    Depression    Diabetes mellitus without complication (HCC)    History of kidney stones    Hydrocephalus (HCC)    Migraine    Paranoid schizophrenia (HCC)    Personality disorder (HCC)    Psychotic disorder (HCC)    PTSD (post-traumatic stress disorder)    Schwannoma    Past Surgical History:  Procedure Laterality Date   CESAREAN SECTION     x2   TUBAL LIGATION     Patient Active Problem List   Diagnosis Date Noted   DOE (dyspnea on exertion) 12/15/2023   OAB (overactive bladder) 09/10/2023   Mixed stress and urge urinary incontinence 09/10/2023   History of kidney stones 07/21/2023   Trichomoniasis 06/01/2023   Urinary incontinence with continuous leakage 05/27/2023   Sleep disturbance 09/24/2022   Moody 09/24/2022   Hot flashes 09/24/2022   Perimenopause 09/24/2022   Cocaine use disorder (HCC) 08/29/2022   Adjustment disorder with mixed anxiety and depressed mood 08/29/2022   Substance induced mood disorder (HCC) 08/29/2022   GERD without esophagitis 02/29/2016   Umbilical hernia, reducible 02/29/2016   Asthma 01/10/2016    PCP: Twylla Galen,  MD   REFERRING PROVIDER: Twylla Galen, MD  REFERRING DIAG: right knee pain  THERAPY DIAG:  Weakness of both lower extremities  Chronic pain of right knee  Rationale for Evaluation and Treatment: Rehabilitation  ONSET DATE: 2017, pregnant with daughter is when the knee started behaving wierd  SUBJECTIVE:   SUBJECTIVE STATEMENT:  Pt states 6/10 pain today. Pt states knee and calf have been tight since last session. Pt reports swelling has not been as visible today just feels tight.  PN on 03/03/24: About "20%" better overall since start of therapy; Patient reports soreness today especially in her glutes from therapy yesterday.   Evals: Pt reports right knee pain starting when she was pregnant with her daughter in 2017. Pt state the knee started to act "weird" and have "given out" several times at various times and has caused her to fall forward about 5 times in the last 6 months. Pt reports pain rating of 7/10 after walking for 5 minutes before having to take seated breaks. Walking causes pain, can walk about a minute before it starts hurting.    PERTINENT HISTORY: -diagnosed with diabetes about 9 months ago -on weight loss medications PAIN:  Are you having pain? Yes: NPRS scale: 0/10 Pain location: medial hamstring area, IT band (medial side worse) Pain description: continuous sharp pain Aggravating factors: walking, stairs  Relieving factors: rest, non weightbearing, keeping it bent  PRECAUTIONS: Fall  RED FLAGS: None Lost 26 pounds but on weight loss medication  WEIGHT BEARING RESTRICTIONS: No  FALLS:  Has patient fallen in last 6 months? Yes. Number of falls 5, forward right knee giving out  LIVING ENVIRONMENT: Lives with: lives with their family and lives with their partner Lives in: Mobile home Stairs: WC ramp Has following equipment at home: Single point cane  OCCUPATION: non-employed  PLOF: Independent  PATIENT GOALS: walk for a mile with no pain, go up  and down stairs without fear of falling  NEXT MD VISIT: none reported  OBJECTIVE:  Note: Objective measures were completed at Evaluation unless otherwise noted.  DIAGNOSTIC FINDINGS: none of RLE  PATIENT SURVEYS:  LEFS 23/80  COGNITION: Overall cognitive status: Within functional limits for tasks assessed     SENSATION: WFL     PALPATION: Tenderness medial joint line, anterior knee  LOWER EXTREMITY ROM:  Active ROM Right eval Left eval Right  03/09/24  Hip flexion     Hip extension     Hip abduction     Hip adduction     Hip internal rotation     Hip external rotation     Knee flexion 109, pain in right hip  115 133 pain free  Knee extension     Ankle dorsiflexion     Ankle plantarflexion     Ankle inversion     Ankle eversion      (Blank rows = not tested)  LOWER EXTREMITY MMT:  MMT Right eval Left eval Right  03/03/24 Left 03/03/24  Hip flexion 4, pain 4, pain 4+ 4+  Hip extension      Hip abduction 5 5    Hip adduction 5 5    Hip internal rotation      Hip external rotation      Knee flexion 3+, pain 4    Knee extension 3+, pain 4 4 4+  Ankle dorsiflexion 5 5    Ankle plantarflexion      Ankle inversion      Ankle eversion       (Blank rows = not tested)  LOWER EXTREMITY SPECIAL TESTS:  Knee special tests: McMurray's test: positive   FUNCTIONAL TESTS:  5 times sit to stand: 19.36, right knee feels wobbly 2 minute walk test: 400 feet, pain in the right knee 2/10  Stair ambulation: TBA next session GAIT: Distance walked: 400 feet Assistive device utilized: None Level of assistance: Complete Independence Comments: decreased velocity, especially at the end of the gait assessment, pt demonstrates somewhat symmetrical gait pattern with increased heel strike noticed on the RLE as well as decreased stance time on RLE on occasion.                                                                                                                                 TREATMENT DATE:  03/24/2024  Manual Therapy: -Tibiofemoral PA and AP mobilizations -STM of Calf and hamstrings, posterior right knee  Therapeutic Exercise: -Bike, level 3 resistance, 5 minutes -Step overs with RLE on bosu ball, 2 set of 8 reps -Knee Drive stretch 10 second holds both directions, 1 set of 10 reps -Leg press, 2 sets of 10 reps, plate #5 -Seated hamstring stretch, 2 reps, 30 seconds, bilaterally -Standing calf stretch, 3 sets of 30 seconds holds -Ankle pumps 1 set of 20 reps  -Sit to stands, 2 sets of 7 reps, throughout session  Ice pack applied in seated position with right leg elevated for 4 minutes  03/23/2024  Therapeutic Exercise: -Bike, level 3 resistance, 5 minutes -Tidal tank walks/marches, 30 feet per lap, 2 laps each variation -TKEs on 4 inch step, 1 set of 8 reps bilaterally, pt requiring increased UE support today due to instability and pain -Standing 3 way hip 1 sets 5 reps, bilaterally, pt cued for upright trunk and maintaining of neutral spine -Lateral stepping  (with mini squat last lap only), 3 laps 20 feet per lap, with GTB around ankles, pt cued for upright posture -Monster walks, GTB at ankles, pt cued for eccentric control, 3 laps, 20 feet per lap -Power walks with high knee, 2 laps, 60 feet per lap, pt requires 2 standing rest breaks    Therapeutic Activity: -Sit to stands with tidal tank sphere, 2 sets of 7 reps, pt cued for core activation and lateral movement of sphere    03/16/2024  Therapeutic Exercise: -Bike, level 2 resistance, 5 minutes -Tidal tank walks/marches, 30 feet per lap, 2 laps each variation -Lateral step up and overs, 2 set of 5 reps, 8 inch step, no UE support needed -Standing reactive 3 way hip 2 sets 3 reps, bilaterally, pt cued for upright trunk and maintaining of neutral spine -Lateral stepping  with mini squat, 3 laps 20 feet per lap, with GTB around ankles, pt cued for upright posture -Gastroc stretch, 2 set  of 30 seconds -Hamstring stretch, 1 set of 30 seconds   Therapeutic Activity: -Sit to stands with tidal tank, 2 sets of 8 reps, pt cued for core activation -Lateral step up and overs, 2 sets of 8 reps, 6 inch step      PATIENT EDUCATION:  Education details: Pt was educated on findings of PT evaluation, prognosis, frequency of therapy visits and rationale, attendance policy, and HEP if given.   Person educated: Patient Education method: Explanation and Handouts Education comprehension: verbalized understanding and needs further education  HOME EXERCISE PROGRAM: Access Code: [email or web address] URL: [email or web address] Date: 02/17/2024 Prepared by: Armond Bertin  Exercises - Seated Long Arc Quad  - 1 x daily - 3 sets - 10 reps - Standing Heel Raise with Support  - 1 x daily - 3 sets - 10 reps - Sit to Stand with Arms Crossed  - 1 x daily - 3 sets - 10 reps  03/09/24: - Prone Quadriceps Stretch with Strap  - 2 x daily - 7 x weekly - 1 sets - 3 reps - 30" hold - Squat with Chair Touch  - 2 x daily - 7 x weekly - 2 sets - 10 reps - 3" hold - Standing 3-Way Kick  - 1 x daily - 7 x weekly - 3 sets - 10 reps - Side Stepping with Resistance at Thighs and Counter Support  - 2 x daily - 7 x weekly - 1 sets - 5 reps - Standing Tandem Balance  with Counter Support  - 2 x daily - 7 x weekly - 1 sets - 3 reps - 30" hold  ASSESSMENT:  CLINICAL IMPRESSION: Patient continues to demonstrate right knee pain, decreased LE strength, pain free ROM of right knee, decreased gait quality and balance. Patient responds well to manual therapy of R knee. Manual therapy included PA and AP tibiofemoral glides, flexion gapping technique and STM to posterior calf and hamstring attachments at knee. Pt reports decrease in knee pain following manual therapy and ice. Regressed step up and bending with loading due to increase in pain in the last week.  Patient would continue to benefit from skilled physical therapy  for increased endurance with ambulation, increased LE strength, decreased pain, and improved balance for improved quality of life, improved community ambulation and continued progress towards therapy goals.      Eval:  Patient is a 53 y.o. female who was seen today for physical therapy evaluation and treatment for right knee pain.   Patient demonstrates decreased LE strength, abnormal pain rating, and impaired gait quality. Patient also demonstrates difficulty with ambulation during today's session with decreased stride length and velocity noted. Patient also demonstrates palpable tenderness ot medial joint line and extension over pressure of R knee. Patient requires education on POC and on plan to use kinesiotherapy in the future after a couple of weeks of HEP compliance. Patient would benefit from skilled physical therapy for increased endurance with ambulation, increased LE strength, decreased RLE pain and improved balance for improved gait quality, return to higher level of function with ADLs, and progress towards therapy goals.   OBJECTIVE IMPAIRMENTS: Abnormal gait, decreased activity tolerance, decreased balance, decreased endurance, decreased mobility, difficulty walking, decreased ROM, decreased strength, obesity, and pain.   ACTIVITY LIMITATIONS: carrying, lifting, bending, standing, squatting, stairs, transfers, and locomotion level  PARTICIPATION LIMITATIONS: meal prep, cleaning, shopping, community activity, and yard work  PERSONAL FACTORS: Age, Fitness, Past/current experiences, Time since onset of injury/illness/exacerbation, and 1 comorbidity: diabetes are also affecting patient's functional outcome.   REHAB POTENTIAL: Good  CLINICAL DECISION MAKING: Stable/uncomplicated  EVALUATION COMPLEXITY: Low   GOALS: Goals reviewed with patient? No  SHORT TERM GOALS: Target date: 03/09/24  Patient will demonstrate evidence of independence with individualized HEP and will report  compliance for at least 3 days per week for optimized progression towards remaining therapy goals. Baseline:  Goal status: IN PROGRESS  2.  Patient will report a decrease in pain level during community ambulation by at least 2 points for improved quality of life. Baseline: see above Goal status: IN PROGRESS     LONG TERM GOALS: Target date: 03/30/24  Pt will demonstrate a an increase of at least 9 points on the LEFS for improved performance of community ambulation and ADL. Baseline: see above; 30/80 (7 point improvement) 03/03/24 Goal status: IN PROGRESS  2.  Pt will improve 2 MWT by 140 feet (and decrease pain rating) in order to demonstrate improved functional ambulatory capacity in community setting.  Baseline: see above; 03/03/24 471 ft (improved by 71 ft) Goal status: IN PROGRESS  3.  Pt will demonstrate 5 degree increase in R knee ROM (flexion and extension) in right knee, for increased mobility and maximal efficiency of gait cycle during ambulation. Baseline: see above Goal status: IN PROGRESS  4.  Pt will demonstrate at least 4/5 MMT for right lower extremity for increased strength during ADL and community ambulation. Baseline: see above Goal status: IN PROGRESS  5.  Pt will report  ability to walk for extended distances, longer than 3 minutes, without pain for increased endurance and increased quality of life. Baseline: unable to walk 2 minutes without increase in pain Goal status: IN PROGRESS    PLAN:  PT FREQUENCY: 2x/week  PT DURATION: 6 weeks  PLANNED INTERVENTIONS: 97110-Therapeutic exercises, 97530- Therapeutic activity, [id number]- Neuromuscular re-education, 97535- Self Care, 78469- Manual therapy, [phone]- Gait training, Patient/Family education, Balance training, Stair training, Taping, Joint mobilization, Cryotherapy, and Moist heat  PLAN FOR NEXT SESSION: progress SLS, BLE strengthening, and endurance training.  Progress balance exercise. Manual for pain  management.  Armond Bertin, PT, DPT [facility] Office: [phone] 11:55 AM, 03/24/24

## 2024-03-25 ENCOUNTER — Ambulatory Visit: Payer: Medicaid Other | Attending: Internal Medicine | Admitting: Internal Medicine

## 2024-03-25 ENCOUNTER — Encounter: Payer: Self-pay | Admitting: Internal Medicine

## 2024-03-25 VITALS — BP 110/72 | HR 78 | Ht 64.0 in | Wt 296.0 lb

## 2024-03-25 DIAGNOSIS — G4733 Obstructive sleep apnea (adult) (pediatric): Secondary | ICD-10-CM | POA: Insufficient documentation

## 2024-03-25 DIAGNOSIS — R0609 Other forms of dyspnea: Secondary | ICD-10-CM | POA: Diagnosis present

## 2024-03-25 DIAGNOSIS — Z79899 Other long term (current) drug therapy: Secondary | ICD-10-CM | POA: Insufficient documentation

## 2024-03-25 DIAGNOSIS — E119 Type 2 diabetes mellitus without complications: Secondary | ICD-10-CM | POA: Diagnosis present

## 2024-03-25 MED ORDER — DAPAGLIFLOZIN PROPANEDIOL 10 MG PO TABS: 10.0000 mg | ORAL_TABLET | Freq: Every day | ORAL | 6 refills | Status: DC

## 2024-03-25 NOTE — Patient Instructions (Signed)
 Medication Instructions:   Begin Farxiga 10mg  daily  Continue all other medications.     Labwork:  BMET - orders given today  Please do 5 days after beginning the Farxiga  Testing/Procedures:  none  Follow-Up:  6 months   Any Other Special Instructions Will Be Listed Below (If Applicable).  You have been referred to:  Pulmonology   If you need a refill on your cardiac medications before your next appointment, please call your pharmacy.

## 2024-03-25 NOTE — Progress Notes (Signed)
 Cardiology Office Note  Date: 03/25/2024   ID: Sherry Cole, DOB [birth year], MRN [id number]  PCP:  Twylla Galen, MD  Cardiologist:  Lasalle Pointer, MD Electrophysiologist:  None   History of Present Illness: Sherry Cole is a 53 y.o. female known to have DM 2 is here for follow-up visit.  Ongoing DOE for the last 1 year.  Echocardiogram in February 25 showed normal LVEF, normal diastology, normal RV function, no valvular heart disease but CVP was 15 mmHg.  P.o. Lasix  20 mg once daily was started but she reported no improvement in her symptoms.  Subsequent Lexiscan  was obtained that showed intermediate risk study, TID, moderate region of mild ischemia in the mid/distal anterior, anteroseptal, apical walls, cannot exclude some shifting of breast tissue.  She then underwent CT cardiac that showed coronary calcium score of 6.72, 87th percentile for her age and sex matched control, nonobstructive CAD, normal ascending aorta and mild total plaque volume.  She reported no improvement in DOE after starting Lasix .  She was recently started on Ozempic by her PCP for diabetes management.  No angina but she has positional dizziness and palpitations.  No syncope or leg swelling.  She reported having snoring and nighttime apneas, as stated by her boyfriend.  She was never tested for OSA.  Past Medical History:  Diagnosis Date   Asthma    Bipolar 1 disorder (HCC)    Depression    Diabetes mellitus without complication (HCC)    History of kidney stones    Hydrocephalus (HCC)    Migraine    Paranoid schizophrenia (HCC)    Personality disorder (HCC)    Psychotic disorder (HCC)    PTSD (post-traumatic stress disorder)    Schwannoma     Past Surgical History:  Procedure Laterality Date   CESAREAN SECTION     x2   TUBAL LIGATION      Current Outpatient Medications  Medication Sig Dispense Refill   acetaminophen  (TYLENOL ) 500 MG tablet Take 500-1,000 mg by mouth every 6  (six) hours as needed (pain.).     albuterol  (PROVENTIL  HFA;VENTOLIN  HFA) 108 (90 BASE) MCG/ACT inhaler Inhale 2 puffs into the lungs every 4 (four) hours as needed for wheezing or shortness of breath. 1 Inhaler 1   atorvastatin (LIPITOR) 40 MG tablet Take 40 mg by mouth at bedtime.     B Complex-C (B-COMPLEX WITH VITAMIN C) tablet Take 1 tablet by mouth daily.     Cholecalciferol (VITAMIN D3 PO) Take 1 tablet by mouth daily.     dapagliflozin propanediol (FARXIGA) 10 MG TABS tablet Take 1 tablet (10 mg total) by mouth daily. 30 tablet 6   diphenhydrAMINE  (BENADRYL ) 25 MG tablet Take 25 mg by mouth every 6 (six) hours as needed (animal allergies.).     ferrous sulfate 325 (65 FE) MG tablet Take 325 mg by mouth daily.     FLUoxetine  (PROZAC ) 20 MG capsule Take 20 mg by mouth at bedtime.     furosemide  (LASIX ) 20 MG tablet Take 1 tablet (20 mg total) by mouth daily. 90 tablet 3   gabapentin (NEURONTIN) 400 MG capsule Take 400 mg by mouth 3 (three) times daily.     guanFACINE (TENEX) 1 MG tablet Take 1 mg by mouth at bedtime.     hydrOXYzine  (VISTARIL ) 50 MG capsule TAKE ONE CAPSULE BY MOUTH THREE TIMES DAILY AS NEEDED FOR ANXIETY for 30 days     Multiple Vitamin (MULTIVITAMIN WITH MINERALS) TABS tablet Take 1  tablet by mouth daily.     OZEMPIC, 0.25 OR 0.5 MG/DOSE, 2 MG/3ML SOPN 0.25 mg once a week Subcutaneous for 30 days     potassium chloride  SA (KLOR-CON  M) 20 MEQ tablet Take 1 tablet (20 mEq total) by mouth 2 (two) times daily. 180 tablet 3   Probiotic Product (PROBIOTIC PO) Take 1 tablet by mouth daily. Gummy     progesterone  (PROMETRIUM ) 100 MG capsule TAKE ONE CAPSULE BY MOUTH AT BEDTIME 30 capsule 6   traZODone (DESYREL) 100 MG tablet Take 100 mg by mouth at bedtime as needed for sleep.     VRAYLAR 3 MG capsule Take by mouth daily.     No current facility-administered medications for this visit.   Allergies:  Patient has no known allergies.   Social History: The patient  reports that  she has never smoked. She has never used smokeless tobacco. She reports that she does not currently use drugs after having used the following drugs: Cocaine and Marijuana. She reports that she does not drink alcohol.   Family History: The patient's family history includes COPD in her maternal grandmother; Cancer in her maternal grandmother; Heart disease in her maternal grandmother and mother; Hyperlipidemia in her maternal grandfather and maternal grandmother.   ROS:  Please see the history of present illness. Otherwise, complete review of systems is positive for none  All other systems are reviewed and negative.   Physical Exam: VS:  BP 110/72 (BP Location: Left Arm, Patient Position: Sitting, Cuff Size: Normal)   Pulse 78   Ht 5\' 4"  (1.626 m)   Wt 296 lb (134.3 kg)   SpO2 97%   BMI 50.81 kg/m , BMI Body mass index is 50.81 kg/m.  Wt Readings from Last 3 Encounters:  03/25/24 296 lb (134.3 kg)  12/15/23 294 lb 9.6 oz (133.6 kg)  11/27/23 299 lb (135.6 kg)    General: Patient appears comfortable at rest. HEENT: Conjunctiva and lids normal, oropharynx clear with moist mucosa. Neck: Supple, no elevated JVP or carotid bruits, no thyromegaly. Lungs: Clear to auscultation, nonlabored breathing at rest. Cardiac: Regular rate and rhythm, no S3 or significant systolic murmur, no pericardial rub. Abdomen: Soft, nontender, no hepatomegaly, bowel sounds present, no guarding or rebound. Extremities: No pitting edema, distal pulses 2+. Skin: Warm and dry. Musculoskeletal: No kyphosis. Neuropsychiatric: Alert and oriented x3, affect grossly appropriate.  Recent Labwork: 04/03/2023: ALT 29; AST 23; Hemoglobin 13.5; Platelets 303 01/27/2024: BUN 12; Creatinine, Ser 0.77; Potassium 4.5; Sodium 137  No results found for: "CHOL", "TRIG", "HDL", "CHOLHDL", "VLDL", "LDLCALC", "LDLDIRECT"    Assessment and Plan:   DOE, rule out chronic diastolic heart failure: Ongoing DOE for the last 1 year.   Echocardiogram showed normal LVEF, normal diastology, normal RV function, no valve dysfunction but CVP was 15 mmHg.  P.o. Lasix  20 mg once daily was started but she reported no improvement in her DOE. Will start Farxiga 10 mg once daily and obtain BMP in 5 days.  Hopefully her DOE will improve.  If not, we will discontinue Farxiga and recommend weight loss.  But she is at a higher risk of diastolic heart failure due to undiagnosed possible OSA.  Ischemic evaluation was negative for any CAD.  DM 2, poorly controlled: Recently started on Ozempic by PCP.  Continue Ozempic.  OSA screening: She reported symptoms of snoring, nighttime apneas.  Will refer to sleep medicine for OSA evaluation.  Positional dizziness/palpitations: Patient has dizziness and palpitations only from sitting to  standing position.  She does not have any exertional dizziness or palpitations.  Will defer event monitor for now.      Medication Adjustments/Labs and Tests Ordered: Current medicines are reviewed at length with the patient today.  Concerns regarding medicines are outlined above.    Disposition:  Follow up 6 months  Signed Mckaila Duffus Priya Whitnee Orzel, MD, 03/25/2024 9:47 AM    [facility] at [facility] [address] [place], [place], [place] [address]

## 2024-03-28 ENCOUNTER — Encounter (HOSPITAL_COMMUNITY): Admitting: Physical Therapy

## 2024-03-28 ENCOUNTER — Other Ambulatory Visit (HOSPITAL_COMMUNITY): Payer: Self-pay

## 2024-03-28 ENCOUNTER — Telehealth: Payer: Self-pay

## 2024-03-28 NOTE — Telephone Encounter (Signed)
 Pharmacy Patient Advocate Encounter  Received notification from [facility] that Prior Authorization for FARXIGA  has been APPROVED from 03/28/24 to 03/28/25. Ran test claim, Copay is $4. This test claim was processed through [facility]- copay amounts may vary at other pharmacies due to pharmacy/plan contracts, or as the patient moves through the different stages of their insurance plan.

## 2024-03-28 NOTE — Telephone Encounter (Signed)
 Pharmacy Patient Advocate Encounter   Received notification from CoverMyMeds that prior authorization for FARXIGA  is required/requested.   Insurance verification completed.   The patient is insured through [employer] .   Per test claim: PA required; PA submitted to above mentioned insurance via CoverMyMeds Key/confirmation #/EOC [id number] Status is pending

## 2024-03-30 ENCOUNTER — Encounter (HOSPITAL_COMMUNITY)

## 2024-04-06 ENCOUNTER — Encounter (HOSPITAL_COMMUNITY): Payer: Self-pay

## 2024-04-06 ENCOUNTER — Ambulatory Visit (HOSPITAL_COMMUNITY)

## 2024-04-06 DIAGNOSIS — R29898 Other symptoms and signs involving the musculoskeletal system: Secondary | ICD-10-CM | POA: Diagnosis not present

## 2024-04-06 DIAGNOSIS — G8929 Other chronic pain: Secondary | ICD-10-CM

## 2024-04-06 NOTE — Therapy (Addendum)
 OUTPATIENT PHYSICAL THERAPY LOWER EXTREMITY TREATMENT   PHYSICAL THERAPY DISCHARGE SUMMARY  Visits from Start of Care: 9  Current functional level related to goals / functional outcomes: See below   Remaining deficits: See below   Education / Equipment: See below   Patient agrees to discharge. Patient goals were partially met. Patient is being discharged due to met 6/7 goals, pt satisfied to discharge to HEP.  Patient Name: Sherry Cole MRN: [id number] DOB:08-03-1971, 53 y.o., female Today's Date: 04/06/2024  END OF SESSION:  PT End of Session - 04/06/24 1058     Visit Number 11    Number of Visits 12    Date for PT Re-Evaluation 04/14/24    Authorization Type [place] MEDICAID HEALTHY BLUE    Authorization Time Period healthy blue approved 4 visits from 02/25/24-03/25/24;healthy blue approved 4 visits from 03/16/2024-04/14/2024    Authorization - Number of Visits 4    Progress Note Due on Visit 4    PT Start Time 1058    PT Stop Time 1138    PT Time Calculation (min) 40 min    Activity Tolerance Patient tolerated treatment well;No increased pain    Behavior During Therapy WFL for tasks assessed/performed                Past Medical History:  Diagnosis Date   Asthma    Bipolar 1 disorder (HCC)    Depression    Diabetes mellitus without complication (HCC)    History of kidney stones    Hydrocephalus (HCC)    Migraine    Paranoid schizophrenia (HCC)    Personality disorder (HCC)    Psychotic disorder (HCC)    PTSD (post-traumatic stress disorder)    Schwannoma    Past Surgical History:  Procedure Laterality Date   CESAREAN SECTION     x2   TUBAL LIGATION     Patient Active Problem List   Diagnosis Date Noted   OSA (obstructive sleep apnea) 03/25/2024   Type 2 diabetes mellitus without complication, without long-term current use of insulin (HCC) 03/25/2024   DOE (dyspnea on exertion) 12/15/2023   OAB (overactive bladder) 09/10/2023   Mixed stress  and urge urinary incontinence 09/10/2023   History of kidney stones 07/21/2023   Trichomoniasis 06/01/2023   Urinary incontinence with continuous leakage 05/27/2023   Sleep disturbance 09/24/2022   Moody 09/24/2022   Hot flashes 09/24/2022   Perimenopause 09/24/2022   Cocaine use disorder (HCC) 08/29/2022   Adjustment disorder with mixed anxiety and depressed mood 08/29/2022   Substance induced mood disorder (HCC) 08/29/2022   GERD without esophagitis 02/29/2016   Umbilical hernia, reducible 02/29/2016   Asthma 01/10/2016    PCP: Twylla Galen, MD   REFERRING PROVIDER: Twylla Galen, MD  REFERRING DIAG: right knee pain  THERAPY DIAG:  Weakness of both lower extremities  Chronic pain of right knee  Rationale for Evaluation and Treatment: Rehabilitation  ONSET DATE: 2017, pregnant with daughter is when the knee started behaving wierd  SUBJECTIVE:   SUBJECTIVE STATEMENT:  04/06/24:  Reports she was kicking ball with her daughter, had a slide but able to recover, feels she is getting stronger.  Reports 2 falls in the last 4 weeks due to knee unstable.  Reports increased Rt knee pain following the slide, pain scale 6/10 constant achy, tender and sharp pain.  Feels she has improved by 75%.  PN on 03/03/24: About "20%" better overall since start of therapy; Patient reports soreness today especially in her glutes  from therapy yesterday.  Evals: Pt reports right knee pain starting when she was pregnant with her daughter in 2017. Pt state the knee started to act "weird" and have "given out" several times at various times and has caused her to fall forward about 5 times in the last 6 months. Pt reports pain rating of 7/10 after walking for 5 minutes before having to take seated breaks. Walking causes pain, can walk about a minute before it starts hurting.    PERTINENT HISTORY: -diagnosed with diabetes about 9 months ago -on weight loss medications PAIN:  Are you having pain? Yes:  NPRS scale: 0/10 Pain location: medial hamstring area, IT band (medial side worse) Pain description: continuous sharp pain Aggravating factors: walking, stairs Relieving factors: rest, non weightbearing, keeping it bent  PRECAUTIONS: Fall  RED FLAGS: None Lost 26 pounds but on weight loss medication  WEIGHT BEARING RESTRICTIONS: No  FALLS:  Has patient fallen in last 6 months? Yes. Number of falls 5, forward right knee giving out  LIVING ENVIRONMENT: Lives with: lives with their family and lives with their partner Lives in: Mobile home Stairs: WC ramp Has following equipment at home: Single point cane  OCCUPATION: non-employed  PLOF: Independent  PATIENT GOALS: walk for a mile with no pain, go up and down stairs without fear of falling  NEXT MD VISIT: none reported  OBJECTIVE:  Note: Objective measures were completed at Evaluation unless otherwise noted.  DIAGNOSTIC FINDINGS: none of RLE  PATIENT SURVEYS:  LEFS 23/80  COGNITION: Overall cognitive status: Within functional limits for tasks assessed     SENSATION: WFL     PALPATION: Tenderness medial joint line, anterior knee  LOWER EXTREMITY ROM:  Active ROM Right eval Left eval Right  03/09/24  Hip flexion     Hip extension     Hip abduction     Hip adduction     Hip internal rotation     Hip external rotation     Knee flexion 109, pain in right hip  115 133 pain free  Knee extension     Ankle dorsiflexion     Ankle plantarflexion     Ankle inversion     Ankle eversion      (Blank rows = not tested)  LOWER EXTREMITY MMT:  MMT Right eval Left eval Right  03/03/24 Left 03/03/24 Right 04/06/24 Left  04/06/24  Hip flexion 4, pain 4, pain 4+ 4+ 4+ 5/5  Hip extension     5/5 4+  Hip abduction 5 5      Hip adduction 5 5      Hip internal rotation        Hip external rotation        Knee flexion 3+, pain 4   5/5 4+  Knee extension 3+, pain 4 4 4+ 5 5  Ankle dorsiflexion 5 5      Ankle  plantarflexion        Ankle inversion        Ankle eversion         (Blank rows = not tested)  LOWER EXTREMITY SPECIAL TESTS:  Knee special tests: McMurray's test: positive   FUNCTIONAL TESTS:  5 times sit to stand: 19.36, right knee feels wobbly 2 minute walk test: 400 feet, pain in the right knee 2/10  Stair ambulation: TBA next session  04/06/24: 510 no AD, no pain   Able to ambulate 3RT reciprocal pattern no AD   5STS 8.36" GAIT:  Distance walked: 400 feet Assistive device utilized: None Level of assistance: Complete Independence Comments: decreased velocity, especially at the end of the gait assessment, pt demonstrates somewhat symmetrical gait pattern with increased heel strike noticed on the RLE as well as decreased stance time on RLE on occasion.                                                                                                                                TREATMENT DATE:  04/06/24: TA: 535ft no AD 7in step height reciprocal pattern with no HHA MMT see above LEFS 58/80= 72.5 5 STS 8.36"  NMR: Standing: SLS: Lt 17", Rt 35" Vector stance 5x 5" minimal HHA Tandem stance on foam 3x 30"  Manual: - patella mobs all directions - STM to quadriceps, educated use of rolling pin for self manual  03/24/2024  Manual Therapy: -Tibiofemoral PA and AP mobilizations -STM of Calf and hamstrings, posterior right knee  Therapeutic Exercise: -Bike, level 3 resistance, 5 minutes -Step overs with RLE on bosu ball, 2 set of 8 reps -Knee Drive stretch 10 second holds both directions, 1 set of 10 reps -Leg press, 2 sets of 10 reps, plate #5 -Seated hamstring stretch, 2 reps, 30 seconds, bilaterally -Standing calf stretch, 3 sets of 30 seconds holds -Ankle pumps 1 set of 20 reps  -Sit to stands, 2 sets of 7 reps, throughout session  Ice pack applied in seated position with right leg elevated for 4 minutes  03/23/2024  Therapeutic Exercise: -Bike, level 3  resistance, 5 minutes -Tidal tank walks/marches, 30 feet per lap, 2 laps each variation -TKEs on 4 inch step, 1 set of 8 reps bilaterally, pt requiring increased UE support today due to instability and pain -Standing 3 way hip 1 sets 5 reps, bilaterally, pt cued for upright trunk and maintaining of neutral spine -Lateral stepping  (with mini squat last lap only), 3 laps 20 feet per lap, with GTB around ankles, pt cued for upright posture -Monster walks, GTB at ankles, pt cued for eccentric control, 3 laps, 20 feet per lap -Power walks with high knee, 2 laps, 60 feet per lap, pt requires 2 standing rest breaks    Therapeutic Activity: -Sit to stands with tidal tank sphere, 2 sets of 7 reps, pt cued for core activation and lateral movement of sphere    03/16/2024  Therapeutic Exercise: -Bike, level 2 resistance, 5 minutes -Tidal tank walks/marches, 30 feet per lap, 2 laps each variation -Lateral step up and overs, 2 set of 5 reps, 8 inch step, no UE support needed -Standing reactive 3 way hip 2 sets 3 reps, bilaterally, pt cued for upright trunk and maintaining of neutral spine -Lateral stepping  with mini squat, 3 laps 20 feet per lap, with GTB around ankles, pt cued for upright posture -Gastroc stretch, 2 set of 30 seconds -Hamstring stretch, 1 set of 30 seconds   Therapeutic Activity: -Sit to  stands with tidal tank, 2 sets of 8 reps, pt cued for core activation -Lateral step up and overs, 2 sets of 8 reps, 6 inch step      PATIENT EDUCATION:  Education details: Pt was educated on findings of PT evaluation, prognosis, frequency of therapy visits and rationale, attendance policy, and HEP if given.   Person educated: Patient Education method: Explanation and Handouts Education comprehension: verbalized understanding and needs further education  HOME EXERCISE PROGRAM: Access Code: [email or web address] URL: [email or web address] Date: 02/17/2024 Prepared by: Armond Bertin  Exercises - Seated Long Arc Quad  - 1 x daily - 3 sets - 10 reps - Standing Heel Raise with Support  - 1 x daily - 3 sets - 10 reps - Sit to Stand with Arms Crossed  - 1 x daily - 3 sets - 10 reps  03/09/24: - Prone Quadriceps Stretch with Strap  - 2 x daily - 7 x weekly - 1 sets - 3 reps - 30" hold - Squat with Chair Touch  - 2 x daily - 7 x weekly - 2 sets - 10 reps - 3" hold - Standing 3-Way Kick  - 1 x daily - 7 x weekly - 3 sets - 10 reps - Side Stepping with Resistance at Thighs and Counter Support  - 2 x daily - 7 x weekly - 1 sets - 5 reps - Standing Tandem Balance with Counter Support  - 2 x daily - 7 x weekly - 1 sets - 3 reps - 30" hold  ASSESSMENT:  CLINICAL IMPRESSION:  Reviewed goals with objective testing complete and vast improvements:  Pt has met 2/2 STGs and 4/5 LTGs.  Pt presents with increased cadence and speed with , 5STS, able to ambulate reciprocal pattern with no HR requirements all in pain free.  Presents with improve AROM and MMT in all musculature tested.  Improved self perceived functional abilities with LEFS.  EOS with STM to address soft tissue restrictions, educated use of rolling stick to assist with self manual.  Reviewed exercises to continue at home, verbalized understanding.  DC to HEP.  Eval:  Patient is a 53 y.o. female who was seen today for physical therapy evaluation and treatment for right knee pain.   Patient demonstrates decreased LE strength, abnormal pain rating, and impaired gait quality. Patient also demonstrates difficulty with ambulation during today's session with decreased stride length and velocity noted. Patient also demonstrates palpable tenderness ot medial joint line and extension over pressure of R knee. Patient requires education on POC and on plan to use kinesiotherapy in the future after a couple of weeks of HEP compliance. Patient would benefit from skilled physical therapy for increased endurance with ambulation, increased  LE strength, decreased RLE pain and improved balance for improved gait quality, return to higher level of function with ADLs, and progress towards therapy goals.   OBJECTIVE IMPAIRMENTS: Abnormal gait, decreased activity tolerance, decreased balance, decreased endurance, decreased mobility, difficulty walking, decreased ROM, decreased strength, obesity, and pain.   ACTIVITY LIMITATIONS: carrying, lifting, bending, standing, squatting, stairs, transfers, and locomotion level  PARTICIPATION LIMITATIONS: meal prep, cleaning, shopping, community activity, and yard work  PERSONAL FACTORS: Age, Fitness, Past/current experiences, Time since onset of injury/illness/exacerbation, and 1 comorbidity: diabetes are also affecting patient's functional outcome.   REHAB POTENTIAL: Good  CLINICAL DECISION MAKING: Stable/uncomplicated  EVALUATION COMPLEXITY: Low   GOALS: Goals reviewed with patient? No  SHORT TERM GOALS: Target date: 03/09/24  Patient will demonstrate evidence of independence  with individualized HEP and will report compliance for at least 3 days per week for optimized progression towards remaining therapy goals. Baseline: 04/06/24:  Reports compliance with advanced HEP 6 days a week. Goal status: MET  2.  Patient will report a decrease in pain level during community ambulation by at least 2 points for improved quality of life. Baseline: see above; 04/06/24:  No reports of pain during gait.  Reports ability to stand 10 minutes  Goal status: MET     LONG TERM GOALS: Target date: 03/30/24  Pt will demonstrate a an increase of at least 9 points on the LEFS for improved performance of community ambulation and ADL. Baseline: see above; 30/80 (7 point improvement) 03/03/24; 58/80= 72.5% (point improvement) Goal status: MET  2.  Pt will improve 2 MWT by 140 feet (and decrease pain rating) in order to demonstrate improved functional ambulatory capacity in community setting.  Baseline: see  above; 03/03/24 471 ft (improved by 71 ft); 04/06/24:  510 ft pain free (improved by 110 ft). Goal status: IN PROGRESS  3.  Pt will demonstrate 5 degree increase in R knee ROM (flexion and extension) in right knee, for increased mobility and maximal efficiency of gait cycle during ambulation. Baseline: see above; 03/09/24:  AROM 0-133 Goal status: MET  4.  Pt will demonstrate at least 4/5 MMT for right lower extremity for increased strength during ADL and community ambulation. Baseline: see above; 04/06/24: see above Goal status: MET  5.  Pt will report ability to walk for extended distances, longer than 3 minutes, without pain for increased endurance and increased quality of life. Baseline: unable to walk 2 minutes without increase in pain; 04/06/24:  Able to walk 10-12 minutes pain free.   Goal status: MET    PLAN:  PT FREQUENCY: 2x/week  PT DURATION: 6 weeks  PLANNED INTERVENTIONS: 97110-Therapeutic exercises, 97530- Therapeutic activity, 97112- Neuromuscular re-education, 97535- Self Care, [id number]- Manual therapy, [phone]- Gait training, Patient/Family education, Balance training, Stair training, Taping, Joint mobilization, Cryotherapy, and Moist heat  PLAN FOR NEXT SESSION: DC to HEP  Minor Amble, LPTA/CLT; CBIS [phone]  11:58 AM, 04/06/24  Armond Bertin, PT, DPT [facility] Office: [phone] 3:12 PM, 04/12/24

## 2024-04-07 ENCOUNTER — Encounter: Payer: Self-pay | Admitting: Urology

## 2024-04-07 ENCOUNTER — Other Ambulatory Visit: Payer: Self-pay | Admitting: Urology

## 2024-04-07 ENCOUNTER — Ambulatory Visit: Admitting: Urology

## 2024-04-07 VITALS — BP 133/81 | HR 83 | Temp 97.9°F

## 2024-04-07 DIAGNOSIS — N3281 Overactive bladder: Secondary | ICD-10-CM

## 2024-04-07 DIAGNOSIS — Z87442 Personal history of urinary calculi: Secondary | ICD-10-CM

## 2024-04-07 NOTE — Progress Notes (Signed)
 Name: Sherry Cole DOB: 1971/09/21 MRN: [id number]  History of Present Illness: Sherry Cole is a 53 y.o. female who presents today for follow up visit at [facility] GU History includes: 1. OAB with urinary frequency, nocturia, urgency. 2. Mixed urinary incontinence (stress predominant). 3. Kidney stones.   At initial visit on 07/21/2023: The plan was: 1. For OAB with UUI: - Start Vesicare  10 mg daily.  - Minimize caffeine  intake. - Work on timed voiding. 2. For SUI: - Referred to pelvic floor physical therapy. 3. Re: kidney stones: Asymptomatic. 4. Return in about 8 weeks (around 09/15/2023) for UA, PVR, & f/u with Griselda Lederer NP.   At last visit on 09/16/2023: - Had complete resolution of OAB and MUI symptoms with behavioral modifications (timed voiding, bladder retraining, and decreasing her caffeine  intake). Never started Vesicare  or pelvic floor physical therapy since symptoms responded so well.  - Denied stone concerns.   Since last visit: > 01/27/2024: Normal renal function (creatinine 0.77, GFR >60).  Today: She reports that she had severe right flank pain last month which was intermittent. She was seen in the ER at [facility] in [place] twice and was told that she had kidney stones based on imaging there. Unable to view those records in EMR today; will request. She denies seeing any stone fragments pass in her urine. Denies any persistent flank pain or abdominal pain; states the pain she was experiencing has been resolved for several weeks. She denies any acute urinary concerns at this time.   Medications: Current Outpatient Medications  Medication Sig Dispense Refill   acetaminophen  (TYLENOL ) 500 MG tablet Take 500-1,000 mg by mouth every 6 (six) hours as needed (pain.).     albuterol  (PROVENTIL  HFA;VENTOLIN  HFA) 108 (90 BASE) MCG/ACT inhaler Inhale 2 puffs into the lungs every 4 (four) hours as needed for wheezing or shortness of breath. 1  Inhaler 1   atorvastatin (LIPITOR) 40 MG tablet Take 40 mg by mouth at bedtime.     B Complex-C (B-COMPLEX WITH VITAMIN C) tablet Take 1 tablet by mouth daily.     Cholecalciferol (VITAMIN D3 PO) Take 1 tablet by mouth daily.     dapagliflozin  propanediol (FARXIGA ) 10 MG TABS tablet Take 1 tablet (10 mg total) by mouth daily. (Patient not taking: Reported on 04/07/2024) 30 tablet 6   diphenhydrAMINE  (BENADRYL ) 25 MG tablet Take 25 mg by mouth every 6 (six) hours as needed (animal allergies.).     ferrous sulfate 325 (65 FE) MG tablet Take 325 mg by mouth daily.     FLUoxetine  (PROZAC ) 20 MG capsule Take 20 mg by mouth at bedtime.     furosemide  (LASIX ) 20 MG tablet Take 1 tablet (20 mg total) by mouth daily. 90 tablet 3   gabapentin (NEURONTIN) 400 MG capsule Take 400 mg by mouth 3 (three) times daily.     guanFACINE (TENEX) 1 MG tablet Take 1 mg by mouth at bedtime.     hydrOXYzine  (VISTARIL ) 50 MG capsule TAKE ONE CAPSULE BY MOUTH THREE TIMES DAILY AS NEEDED FOR ANXIETY for 30 days     Multiple Vitamin (MULTIVITAMIN WITH MINERALS) TABS tablet Take 1 tablet by mouth daily.     OZEMPIC, 0.25 OR 0.5 MG/DOSE, 2 MG/3ML SOPN 0.25 mg once a week Subcutaneous for 30 days     potassium chloride  SA (KLOR-CON  M) 20 MEQ tablet Take 1 tablet (20 mEq total) by mouth 2 (two) times daily. 180 tablet 3   Probiotic  Product (PROBIOTIC PO) Take 1 tablet by mouth daily. Gummy     progesterone  (PROMETRIUM ) 100 MG capsule TAKE ONE CAPSULE BY MOUTH AT BEDTIME 30 capsule 6   traZODone (DESYREL) 100 MG tablet Take 100 mg by mouth at bedtime as needed for sleep.     VRAYLAR 3 MG capsule Take by mouth daily.     No current facility-administered medications for this visit.    Allergies: No Known Allergies  Past Medical History:  Diagnosis Date   Asthma    Bipolar 1 disorder (HCC)    Depression    Diabetes mellitus without complication (HCC)    History of kidney stones    Hydrocephalus (HCC)    Migraine     Paranoid schizophrenia (HCC)    Personality disorder (HCC)    Psychotic disorder (HCC)    PTSD (post-traumatic stress disorder)    Schwannoma    Past Surgical History:  Procedure Laterality Date   CESAREAN SECTION     x2   TUBAL LIGATION     Family History  Problem Relation Age of Onset   Heart disease Maternal Grandmother    Cancer Maternal Grandmother    Hyperlipidemia Maternal Grandmother    COPD Maternal Grandmother    Hyperlipidemia Maternal Grandfather    Heart disease Mother    Social History   Socioeconomic History   Marital status: Legally Separated    Spouse name: Not on file   Number of children: Not on file   Years of education: Not on file   Highest education level: Not on file  Occupational History   Not on file  Tobacco Use   Smoking status: Never   Smokeless tobacco: Never  Vaping Use   Vaping status: Never Used  Substance and Sexual Activity   Alcohol use: No   Drug use: Not Currently    Types: Cocaine, Marijuana    Comment: pt denies   Sexual activity: Yes    Partners: Male    Birth control/protection: Surgical    Comment: tubal  Other Topics Concern   Not on file  Social History Narrative   Not on file   Social Drivers of Health   Financial Resource Strain: Medium Risk (05/27/2023)   Overall Financial Resource Strain (CARDIA)    Difficulty of Paying Living Expenses: Somewhat hard  Food Insecurity: No Food Insecurity (05/27/2023)   Hunger Vital Sign    Worried About Running Out of Food in the Last Year: Never true    Ran Out of Food in the Last Year: Never true  Transportation Needs: Unmet Transportation Needs (05/27/2023)   PRAPARE - Transportation    Lack of Transportation (Medical): Yes    Lack of Transportation (Non-Medical): Yes  Physical Activity: Inactive (05/27/2023)   Exercise Vital Sign    Days of Exercise per Week: 0 days    Minutes of Exercise per Session: 0 min  Stress: Stress Concern Present (05/27/2023)   [employer] of [employer]ational [employer] - Occupational Stress Questionnaire    Feeling of Stress : Very much  Social Connections: Moderately Isolated (05/27/2023)   Social Connection and Isolation Panel [NHANES]    Frequency of Communication with Friends and Family: More than three times a week    Frequency of Social Gatherings with Friends and Family: Three times a week    Attends Religious Services: Never    Active Member of Clubs or Organizations: Yes    Attends Banker Meetings: Never    Marital Status: Separated  Intimate Partner Violence: Not At Risk (05/27/2023)   Humiliation, Afraid, Rape, and Kick questionnaire    Fear of Current or Ex-Partner: No    Emotionally Abused: No    Physically Abused: No    Sexually Abused: No    SUBJECTIVE  Review of Systems Constitutional: Patient denies any unintentional weight loss or change in strength lntegumentary: Patient denies any rashes or pruritus Cardiovascular: Patient denies chest pain or syncope Respiratory: Patient denies shortness of breath Gastrointestinal: Patient reports mild nausea which she attributes to her weight loss medication Musculoskeletal: Patient denies muscle cramps or weakness Neurologic: Patient denies convulsions or seizures Allergic/Immunologic: Patient denies recent allergic reaction(s) Hematologic/Lymphatic: Patient denies bleeding tendencies Endocrine: Patient denies heat/cold intolerance  GU: As per HPI.  OBJECTIVE Vitals:   04/07/24 1448  BP: 133/81  Pulse: 83  Temp: 97.9 F (36.6 C)   There is no height or weight on file to calculate BMI.  Physical Examination Constitutional: No obvious distress; patient is non-toxic appearing  Cardiovascular: No visible lower extremity edema.  Respiratory: The patient does not have audible wheezing/stridor; respirations do not appear labored  Gastrointestinal: Abdomen non-distended Musculoskeletal: Normal ROM of UEs  Skin: No obvious rashes/open sores   Neurologic: CN 2-12 grossly intact Psychiatric: Answered questions appropriately with normal affect  Hematologic/Lymphatic/Immunologic: No obvious bruises or sites of spontaneous bleeding  Urine microscopy: >30 WBC/hpf, 3-10 RBC/hpf, many bacteria  ASSESSMENT OAB (overactive bladder) - Plan: Urinalysis, Routine w reflex microscopic  History of kidney stones  She is asymptomatic today for stone or UTI. We discussed asymptomatic bacteriuria versus urine specimen contaminant. Antibiotic treatment is not advised at this time; the rationale for this was discussed with the patient. We discussed antibiotic stewardship and goal to minimize risk for developing antibiotic resistance.  Will request recent ER records from [facility] in [place] for review and we agreed to obtain KUB within 1 week to recheck stone burden. We agreed to determine follow up based on those records / results.   Advised adequate hydration daily for stone prevention. She was advised to go to the ER if She develops fever >100.5 F, uncontrollable pain, or other significantly concerning symptoms.  Patient verbalized understanding of and agreement with current plan. All questions were answered.  PLAN Advised the following: Requesting recent ER records from [facility] in [place] for review. KUB within 1 week. Maintain adequate fluid intake daily. Return for follow up to be determined based on results.  Orders Placed This Encounter  Procedures   Urinalysis, Routine w reflex microscopic   Total time spent caring for the patient today was over 30 minutes. This includes time spent on the date of the visit reviewing the patient's chart before the visit, time spent during the visit, and time spent after the visit on documentation. Over 50% of that time was spent in face-to-face time with this patient for direct counseling. E&M based on time and complexity of medical decision making.  It has been explained that the patient is  to follow regularly with their PCP in addition to all other providers involved in their care and to follow instructions provided by these respective offices. Patient advised to contact urology clinic if any urologic-pertaining questions, concerns, new symptoms or problems arise in the interim period.  There are no Patient Instructions on file for this visit.  Electronically signed by:  Lauretta Ponto, MSN, FNP-C, CUNP 04/07/2024 3:51 PM

## 2024-04-08 ENCOUNTER — Encounter (HOSPITAL_COMMUNITY)

## 2024-04-08 LAB — URINALYSIS, ROUTINE W REFLEX MICROSCOPIC
Bilirubin, UA: NEGATIVE
Glucose, UA: NEGATIVE
Ketones, UA: NEGATIVE
Nitrite, UA: POSITIVE — AB
Specific Gravity, UA: 1.025 (ref 1.005–1.030)
Urobilinogen, Ur: 0.2 mg/dL (ref 0.2–1.0)
pH, UA: 6 (ref 5.0–7.5)

## 2024-04-08 LAB — MICROSCOPIC EXAMINATION: WBC, UA: >30 /HPF — AB (ref 0–5)

## 2024-04-12 ENCOUNTER — Encounter: Payer: Self-pay | Admitting: Urology

## 2024-04-12 NOTE — Progress Notes (Signed)
 Review of scanned records received from [facility] in [place], [place]:  > 01/23/2024:  - Seen in ER for right flank pain and RLQ abdominal pain. - CT showed an obstructing 4 mm right UVJ stone with mild hydronephrosis. - WBC 9.6 - Creatinine 0.59, GFR >60 - Prescriptions: Macrobid, Pyridium, Flomax  > 02/09/2024:  - Seen in ER for right flank pain and abdominal pain. - UA appeared positive for UTI. - CT showed a 4-5 mm right UPJ stone with mild right hydronephrosis. Stable subcentimeter left renal cyst. - Treated with IM Rocephin.  - Prescriptions: Keflex 500 mg BID x7 days, Flomax, Toradol , Norco 5-325 mg  Will advise patient to proceed with KUB as planned to recheck right ureteral stone. She was asymptomatic here on 04/07/2024. Return for follow up to be determined based on results.  Griselda Lederer, MSN, FNP-C, [facility] Nurse Practitioner [facility]

## 2024-05-18 ENCOUNTER — Telehealth: Payer: Self-pay | Admitting: *Deleted

## 2024-05-18 NOTE — Telephone Encounter (Signed)
 Procedure: XI ROBOTIC ASSISTED UMBILICAL HERNIA REPAIR W/ MESH, <3 CM CPT: 49591  Dx: K42.9  Received call from patient [phone]~ telephone.   Patient requested to re-schedule hernia surgery. Upon chart review, patient was scheduled for repair 07/22/2023. Surgery was cancelled due to + UDS.   Patient will need appointment for evaluation prior to re-scheduling surgery.   Call placed to patient. LMTRC.

## 2024-05-18 NOTE — Telephone Encounter (Signed)
Patient returned call and made aware.   Appointment scheduled.   

## 2024-05-27 ENCOUNTER — Other Ambulatory Visit (HOSPITAL_COMMUNITY)
Admission: RE | Admit: 2024-05-27 | Discharge: 2024-05-27 | Disposition: A | Discharge status: Home / Self Care | Source: Ambulatory Visit | Attending: Adult Health | Admitting: Adult Health

## 2024-05-27 ENCOUNTER — Encounter: Payer: Self-pay | Admitting: Adult Health

## 2024-05-27 ENCOUNTER — Ambulatory Visit: Payer: Medicaid Other | Admitting: Adult Health

## 2024-05-27 VITALS — BP 94/65 | HR 76 | Ht 64.0 in | Wt 285.2 lb

## 2024-05-27 DIAGNOSIS — N951 Menopausal and female climacteric states: Secondary | ICD-10-CM

## 2024-05-27 DIAGNOSIS — K429 Umbilical hernia without obstruction or gangrene: Secondary | ICD-10-CM

## 2024-05-27 DIAGNOSIS — R4589 Other symptoms and signs involving emotional state: Secondary | ICD-10-CM | POA: Diagnosis not present

## 2024-05-27 DIAGNOSIS — Z01419 Encounter for gynecological examination (general) (routine) without abnormal findings: Secondary | ICD-10-CM

## 2024-05-27 DIAGNOSIS — R232 Flushing: Secondary | ICD-10-CM

## 2024-05-27 DIAGNOSIS — Z113 Encounter for screening for infections with a predominantly sexual mode of transmission: Secondary | ICD-10-CM | POA: Insufficient documentation

## 2024-05-27 DIAGNOSIS — Z1211 Encounter for screening for malignant neoplasm of colon: Secondary | ICD-10-CM | POA: Insufficient documentation

## 2024-05-27 LAB — HEMOCCULT GUIAC POC 1CARD (OFFICE): Fecal Occult Blood, POC: NEGATIVE

## 2024-05-27 MED ORDER — PROGESTERONE 200 MG PO CAPS
200.0000 mg | ORAL_CAPSULE | Freq: Every day | ORAL | 6 refills | Status: AC
Start: 2024-05-27 — End: ?

## 2024-05-27 NOTE — Progress Notes (Signed)
 Patient ID: Sherry Cole, female   DOB: 09/19/1971, 53 y.o.   MRN: [id number] History of Present Illness: Sherry Cole is a 53 year old white female,separated, G2P2001, in for a well woman gyn exam. She is moody and having hot flashes, periods still monthly, but vary on length, may be 4 days or 2 weeks.      Component Value Date/Time   DIAGPAP  05/27/2023 0945    - Negative for intraepithelial lesion or malignancy (NILM)   HPVHIGH Negative 05/27/2023 0945   ADEQPAP  05/27/2023 0945    Satisfactory for evaluation; transformation zone component PRESENT.    PCP is Dr Katrinka   Current Medications, Allergies, Past Medical History, Past Surgical History, Family History and Social History were reviewed in [facility] electronic medical record.     Review of Systems: Patient denies any hearing loss, fatigue, blurred vision, shortness of breath, chest pain, abdominal pain, problems with bowel movements, urination, or intercourse. No joint pain or mood swings. She has migraines with auras  See HPI for positives   Physical Exam:BP 94/65 (BP Location: Left Arm, Patient Position: Sitting, Cuff Size: Large)   Pulse 76   Ht 5' 4 (1.626 m)   Wt 285 lb 3.2 oz (129.4 kg)   LMP 04/29/2024 (Approximate)   BMI 48.95 kg/m   General:  Well developed, well nourished, no acute distress Skin:  Warm and dry Neck:  Midline trachea, normal thyroid, good ROM, no lymphadenopathy Lungs; Clear to auscultation bilaterally Breast:  No dominant palpable mass, retraction, or nipple discharge Cardiovascular: Regular rate and rhythm Abdomen:  Soft, non tender, no hepatosplenomegaly, has umbilical hernia  Pelvic:  External genitalia is normal in appearance, no lesions.  The vagina is normal in appearance. Urethra has no lesions or masses. The cervix is smooth.   Uterus is felt to be normal size, shape, and contour.  No adnexal masses or tenderness noted.Bladder is non tender, no masses felt. Rectal: Good  sphincter tone, no polyps, or hemorrhoids felt.  Hemoccult negative. Extremities/musculoskeletal:  No swelling or varicosities noted, no clubbing or cyanosis Psych:  No mood changes, alert and cooperative,seems happy AA is 0 Fall risk is low    05/27/2024    8:43 AM 05/27/2023    9:37 AM 09/24/2022    9:39 AM  Depression screen PHQ 2/9  Decreased Interest 3 1 0  Down, Depressed, Hopeless 3 1 1   PHQ - 2 Score 6 2 1   Altered sleeping 2 0 1  Tired, decreased energy 3 3 1   Change in appetite 1 0 0  Feeling bad or failure about yourself  1 1 1   Trouble concentrating 3 0 2  Moving slowly or fidgety/restless 1 0 1  Suicidal thoughts 0 0 0  PHQ-9 Score 17 6 7   Difficult doing work/chores   Not difficult at all   On meds     05/27/2024    8:43 AM 05/27/2023    9:37 AM 09/24/2022    9:40 AM 08/29/2022    8:42 AM  GAD 7 : Generalized Anxiety Score  Nervous, Anxious, on Edge 3 1 2 3   Control/stop worrying 1 2 1 3   Worry too much - different things 2 1 1 3   Trouble relaxing 2 0 1 3  Restless 2 0 2 3  Easily annoyed or irritable 2 1 0 3  Afraid - awful might happen 2 1 0 3  Total GAD 7 Score 14 6 7 21   Anxiety Difficulty   Not  difficult at all       Upstream - 05/27/24 9157       Pregnancy Intention Screening   Does the patient want to become pregnant in the next year? No    Does the patient's partner want to become pregnant in the next year? No    Would the patient like to discuss contraceptive options today? No      Contraception Wrap Up   Current Method Female Sterilization    End Method Female Sterilization    Contraception Counseling Provided No          Examination chaperoned by Clarita Salt LPN  Impression and plan: 1. Encounter for well woman exam with routine gynecological exam (Primary) Pap in 2027 Physical in 1 year Labs with PCP Mammogram was negative 10/03/24 Talk with PCP about cologuard or colonoscopy   2. Screening examination for STD (sexually  transmitted disease) She requests STD testing, has more than 1 partner, will check HIV and RPR She self collected vaginal swab, will get GC/CHL,trich,BV and yeast  - HIV Antibody (routine testing w rflx) - RPR - Cervicovaginal ancillary only( [place])  3. Encounter for screening fecal occult blood testing Hemoccult was negative   4. Hot flashes Will increase Prometrium  to 200 mg   5. [place] Prometrium  was helping will increase to 200 mg 1 daily at bedtime  Meds ordered this encounter  Medications   progesterone  (PROMETRIUM ) 200 MG capsule    Sig: Take 1 capsule (200 mg total) by mouth daily.    Dispense:  30 capsule    Refill:  6    Supervising Provider:   JAYNE MINDER H [[id number]]   Follow up in 6 months for ROS   6. Perimenopause Review handouts  7. Umbilical hernia without obstruction and without gangrene See is see Dr Evonnie soon

## 2024-05-28 LAB — RPR: RPR Ser Ql: NONREACTIVE

## 2024-05-28 LAB — HIV ANTIBODY (ROUTINE TESTING W REFLEX): HIV Screen 4th Generation wRfx: NONREACTIVE

## 2024-05-30 ENCOUNTER — Ambulatory Visit: Payer: Self-pay | Admitting: Women's Health

## 2024-05-30 LAB — CERVICOVAGINAL ANCILLARY ONLY
Bacterial Vaginitis (gardnerella): POSITIVE — AB
Candida Glabrata: NEGATIVE
Candida Vaginitis: NEGATIVE
Chlamydia: NEGATIVE
Comment: NEGATIVE
Comment: NEGATIVE
Comment: NEGATIVE
Comment: NEGATIVE
Comment: NEGATIVE
Comment: NORMAL
Neisseria Gonorrhea: NEGATIVE
Trichomonas: NEGATIVE

## 2024-05-30 MED ORDER — METRONIDAZOLE 500 MG PO TABS: 500.0000 mg | ORAL_TABLET | Freq: Two times a day (BID) | ORAL | 0 refills | Status: DC

## 2024-06-09 ENCOUNTER — Ambulatory Visit: Admitting: Surgery

## 2024-06-14 ENCOUNTER — Ambulatory Visit (INDEPENDENT_AMBULATORY_CARE_PROVIDER_SITE_OTHER): Admitting: Primary Care

## 2024-06-14 ENCOUNTER — Encounter: Payer: Self-pay | Admitting: Primary Care

## 2024-06-14 VITALS — BP 117/77 | HR 81 | Ht 64.0 in | Wt 280.0 lb

## 2024-06-14 DIAGNOSIS — R911 Solitary pulmonary nodule: Secondary | ICD-10-CM

## 2024-06-14 DIAGNOSIS — R0683 Snoring: Secondary | ICD-10-CM

## 2024-06-14 NOTE — Patient Instructions (Signed)
  VISIT SUMMARY: During your visit, we discussed your frequent nighttime awakenings, daytime fatigue, and other health concerns. We suspect you may have obstructive sleep apnea and have planned a home sleep study to confirm this. We also reviewed your asthma management, the pulmonary nodule in your left lung, your weight management, and your nonobstructive coronary artery disease.  YOUR PLAN: -SUSPECTED OBSTRUCTIVE SLEEP APNEA: Obstructive sleep apnea is a condition where your airway becomes blocked during sleep, causing you to wake up frequently. We will conduct a home sleep study to evaluate this. In the meantime, avoid sleeping on your back, avoid alcohol before bed, and do not drive if you feel excessively tired. If diagnosed, treatment may include weight loss, positional therapy, or CPAP therapy.  -DAYTIME FATIGUE: Your daytime fatigue is likely related to the suspected obstructive sleep apnea. We will address this further once we have the results of your sleep study.  -ASTHMA: Your asthma is well-controlled with Advair, but you occasionally need your rescue inhaler. Continue your current asthma management plan.  -PULMONARY NODULE, LEFT LOWER LOBE: A small nodule was found in your left lung. This could be due to various causes, including inflammation or infection. We will monitor this with a follow-up CT scan in October 2025 to ensure it does not change.  -OBESITY: Your weight is contributing to your risk for sleep apnea. You are currently on Ozempic for weight loss, but we discussed the possibility of increasing your dose to 2 mg with your primary care provider to help with weight loss and potentially improve your sleep apnea.  -NONOBSTRUCTIVE CORONARY ARTERY DISEASE: This condition means you have plaque buildup in your arteries without causing a blockage. It's important to focus on weight loss, a healthy diet, and managing your cholesterol to reduce your risk of heart  problems.  INSTRUCTIONS: 1. Complete the home sleep study as ordered. 2. Avoid sleeping on your back and avoid alcohol before bed. 3. Do not drive if you feel excessively tired. 4. Continue your current asthma management plan. 5. Schedule a follow-up CT chest scan in October 2025 to monitor the pulmonary nodule. 6. Discuss with your primary care provider about increasing your Ozempic dose to 2 mg for weight loss. 7. Focus on weight loss, a healthy diet, and managing your cholesterol to reduce your risk of heart problems.  Follow-up Send my chart message 2-3 weeks after completing sleep study for results and treatment options if needed

## 2024-06-14 NOTE — Progress Notes (Signed)
 @Patient  ID: Sherry Cole, female    DOB: [birth year], 53 y.o.   MRN: [id number]  Chief Complaint  Patient presents with   Sleep Apnea    Referred to pulm for possible OSA    Referring provider: Stacia Diannah SQUIBB, MD  HPI: 53 year old female, no tobacco use but smokes crack cocaine. PMH asthma, GERD, type 2 diabetes, substance abuse.   06/14/2024  History of Present Illness  Discussed the use of AI scribe software for clinical note transcription with the patient, who gave verbal consent to proceed.  Sherry Cole is a 53 year old female who presents for a sleep consult.  She experiences frequent nocturnal awakenings, approximately five times per night, often to use the restroom and occasionally due to shortness of breath, necessitating the use of her inhaler. Her sleep schedule consists of going to bed around 11 PM and waking up at 6:30 AM. She takes trazodone 100 mg to aid sleep. Daytime fatigue is significant, with an Epworth Sleepiness Scale score of 17 out of 24.  Her asthma is generally well-controlled with Advair, though she occasionally uses her rescue inhaler, typically once in the morning. No history of cardiac arrhythmias, heart attacks, or strokes is reported. She underwent a cardiac evaluation due to exertional breathlessness and was told by her cardiologist that her heart was fine, but she was informed she has nonobstructive coronary artery disease.  She has a BMI of 48 and has been on Ozempic 1 mg for weight loss, which has plateaued between 275 and 282 pounds. A left lower lobe pulmonary nodule measuring six millimeters was identified in May 2024.  She denies tobacco use but has a history of smoking crack, which she stopped on the 25th of an unspecified month. No history of COPD is noted, and she does not use oxygen . A stress test conducted in March showed a false abnormality, according to her cardiologist.       Allergies  Allergen Reactions   Metformin Hcl  Diarrhea   Other Other (See Comments)    Lillies and roses-migraines  Mammal dander-sneezing and asthma       Valacyclovir Hives     There is no immunization history on file for this patient.  Past Medical History:  Diagnosis Date   Asthma    Bipolar 1 disorder (HCC)    Depression    Diabetes mellitus without complication (HCC)    History of kidney stones    Hydrocephalus (HCC)    Migraine    Paranoid schizophrenia (HCC)    Personality disorder (HCC)    Psychotic disorder (HCC)    PTSD (post-traumatic stress disorder)    Schwannoma     Tobacco History: Social History   Tobacco Use  Smoking Status Never  Smokeless Tobacco Never   Counseling given: Not Answered   Outpatient Medications Prior to Visit  Medication Sig Dispense Refill   acetaminophen  (TYLENOL ) 500 MG tablet Take 500-1,000 mg by mouth every 6 (six) hours as needed (pain.).     ADVAIR DISKUS 100-50 MCG/ACT AEPB 1 puff.     albuterol  (PROVENTIL  HFA;VENTOLIN  HFA) 108 (90 BASE) MCG/ACT inhaler Inhale 2 puffs into the lungs every 4 (four) hours as needed for wheezing or shortness of breath. 1 Inhaler 1   Albuterol  Sulfate 2.5 MG/0.5ML NEBU Every 4 hours as needed for shortness of breath Inhalation; Duration: 30 days     atorvastatin (LIPITOR) 40 MG tablet Take 40 mg by mouth daily.     B Complex-C (B-COMPLEX  WITH VITAMIN C) tablet Take 1 tablet by mouth daily.     Blood Glucose Monitoring Suppl (CONTOUR BLOOD GLUCOSE SYSTEM) w/Device KIT as directed; Duration: 30 days     Cholecalciferol (VITAMIN D3 PO) Take 1 tablet by mouth daily.     diphenhydrAMINE  (BENADRYL ) 25 MG tablet Take 25 mg by mouth every 6 (six) hours as needed (animal allergies.).     ferrous sulfate 325 (65 FE) MG tablet Take 325 mg by mouth daily.     FLUoxetine  (PROZAC ) 20 MG capsule Take 20 mg by mouth at bedtime.     furosemide  (LASIX ) 20 MG tablet Take 1 tablet (20 mg total) by mouth daily. 90 tablet 3   gabapentin (NEURONTIN) 400 MG  capsule Take 400 mg by mouth 3 (three) times daily.     Glucose Blood (BLOOD GLUCOSE TEST STRIPS) STRP use twice a day to check blook sugars In Vitro twice a day; Duration: 30 days     guanFACINE (TENEX) 1 MG tablet Take 1 mg by mouth at bedtime.     hydrOXYzine  (VISTARIL ) 50 MG capsule TAKE ONE CAPSULE BY MOUTH THREE TIMES DAILY AS NEEDED FOR ANXIETY for 30 days     Lancets Super Thin MISC as directed; Duration: 30 days     Multiple Vitamin (MULTIVITAMIN WITH MINERALS) TABS tablet Take 1 tablet by mouth daily.     OZEMPIC, 0.25 OR 0.5 MG/DOSE, 2 MG/3ML SOPN 0.25 mg once a week Subcutaneous for 30 days     potassium chloride  SA (KLOR-CON  M) 20 MEQ tablet Take 1 tablet (20 mEq total) by mouth 2 (two) times daily. 180 tablet 3   Probiotic Product (PROBIOTIC PO) Take 1 tablet by mouth daily. Gummy     progesterone  (PROMETRIUM ) 200 MG capsule Take 1 capsule (200 mg total) by mouth daily. 30 capsule 6   traZODone (DESYREL) 100 MG tablet Take 100 mg by mouth at bedtime as needed for sleep.     VRAYLAR 3 MG capsule Take by mouth daily.     benzonatate (TESSALON) 100 MG capsule 1 capsule as needed Orally Three times a day; Duration: 7 days As needed for cough (Patient not taking: Reported on 06/14/2024)     metroNIDAZOLE  (FLAGYL ) 500 MG tablet Take 1 tablet (500 mg total) by mouth 2 (two) times daily. (Patient not taking: Reported on 06/14/2024) 14 tablet 0   No facility-administered medications prior to visit.      Review of Systems  Review of Systems  Constitutional:  Positive for fatigue.  HENT: Negative.    Respiratory:  Positive for shortness of breath.   Psychiatric/Behavioral:  Positive for sleep disturbance.      Physical Exam  BP 117/77   Pulse 81   Ht 5' 4 (1.626 m)   Wt 280 lb (127 kg)   LMP 05/31/2024 (Approximate)   SpO2 96% Comment: ra  BMI 48.06 kg/m  Physical Exam Constitutional:      General: She is not in acute distress.    Appearance: Normal appearance. She is  well-developed. She is not ill-appearing.  HENT:     Head: Normocephalic and atraumatic.     Mouth/Throat:     Mouth: Mucous membranes are moist.     Pharynx: Oropharynx is clear.  Eyes:     Pupils: Pupils are equal, round, and reactive to light.  Cardiovascular:     Rate and Rhythm: Normal rate and regular rhythm.     Heart sounds: Normal heart sounds. No murmur heard. Pulmonary:  Effort: Pulmonary effort is normal. No respiratory distress.     Breath sounds: Normal breath sounds. No wheezing or rhonchi.  Abdominal:     General: Bowel sounds are normal.     Palpations: Abdomen is soft.     Tenderness: There is no abdominal tenderness.  Musculoskeletal:        General: Normal range of motion.     Cervical back: Normal range of motion and neck supple.  Skin:    General: Skin is warm and dry.     Findings: No erythema or rash.  Neurological:     General: No focal deficit present.     Mental Status: She is alert and oriented to person, place, and time. Mental status is at baseline.  Psychiatric:        Mood and Affect: Mood normal.        Behavior: Behavior normal.        Thought Content: Thought content normal.        Judgment: Judgment normal.       Lab Results:  CBC    Component Value Date/Time   WBC 14.6 (H) 04/03/2023 1903   RBC 4.58 04/03/2023 1903   HGB 13.5 04/03/2023 1903   HGB 10.8 (L) 03/28/2016 0902   HCT 40.7 04/03/2023 1903   HCT 32.6 (L) 03/28/2016 0902   PLT 303 04/03/2023 1903   PLT 278 03/28/2016 0902   MCV 88.9 04/03/2023 1903   MCV 90 03/28/2016 0902   MCH 29.5 04/03/2023 1903   MCHC 33.2 04/03/2023 1903   RDW 13.4 04/03/2023 1903   RDW 13.8 03/28/2016 0902   LYMPHSABS 2.3 04/03/2023 1903   MONOABS 1.3 (H) 04/03/2023 1903   EOSABS 0.2 04/03/2023 1903   BASOSABS 0.1 04/03/2023 1903    BMET    Component Value Date/Time   NA 137 01/27/2024 1010   K 4.5 01/27/2024 1010   CL 97 01/27/2024 1010   CO2 24 01/27/2024 1010   GLUCOSE 173  (H) 01/27/2024 1010   GLUCOSE 152 (H) 07/02/2023 1405   BUN 12 01/27/2024 1010   CREATININE 0.77 01/27/2024 1010   CALCIUM 10.8 (H) 01/27/2024 1010   GFRNONAA >60 07/02/2023 1405   GFRAA >60 10/20/2015 1335    BNP No results found for: BNP  ProBNP No results found for: PROBNP  Imaging: No results found.   Assessment & Plan:   1. Lung nodule (Primary) - CT Chest Wo Contrast; Future  2. Loud snoring - Home sleep test; Future  Assessment & Plan      Suspected obstructive sleep apnea Suspected obstructive sleep apnea due to symptoms of waking up multiple times at night, snoring, and daytime fatigue. High Epworth score of 17/24 indicates significant daytime sleepiness. Risk factors include obesity with a BMI of 48. No prior sleep study conducted. Symptoms suggestive of sleep apnea include witnessed apneas by spouse and loud snoring. Untreated sleep apnea can increase risk for cardiac arrhythmias, stroke, pulmonary hypertension, and diabetes. Treatment depends on severity and may include weight loss, positional therapy, oral appliance, CPAP or referral to ENT. - Order home sleep study to evaluate for sleep apnea. - Advise to avoid sleeping flat on the back and to continue side sleeping. - Counsel on avoiding alcohol before bed as it can worsen sleep apnea. - Instruct to avoid driving if excessively tired. - Discuss potential for CPAP therapy if sleep apnea is diagnosed.  Asthma Asthma is well-controlled with Advair, but she occasionally uses a rescue inhaler, especially  at night. No recent breathing test conducted. No oxygen  use reported.  Pulmonary nodule, left lower lobe New left lower lobe pulmonary nodule measuring 6 mm identified on CTA, which is new from May 2024. Differential includes inflammatory, infectious, scarring, or neoplastic processes. No current evidence of pleural fluid. Non-smoker, but crack use noted. Importance of monitoring for changes to rule out  malignancy discussed. If nodule changes, biopsy or removal may be considered for diagnosis and treatment. - Order follow-up CT chest in October 2025 to monitor pulmonary nodule.  Obesity Obesity with a BMI of 48, contributing to increased risk for sleep apnea. Currently on Ozempic 1 mg for weight loss but has plateaued between 275 and 282 lbs. Discussion about potentially increasing Ozempic dose to 2 mg with primary care provider to aid in weight loss and potentially improve sleep apnea. - Discuss with primary care provider about increasing Ozempic dose to 2 mg.  Nonobstructive coronary artery disease Nonobstructive coronary artery disease identified, indicating plaque buildup without obstruction. Importance of weight loss, healthy diet, and cholesterol management emphasized.    Sherry LELON Ferrari, NP 06/14/2024

## 2024-06-21 ENCOUNTER — Ambulatory Visit: Admitting: Surgery

## 2024-06-21 ENCOUNTER — Other Ambulatory Visit: Payer: Self-pay

## 2024-06-21 ENCOUNTER — Encounter: Payer: Self-pay | Admitting: Surgery

## 2024-06-21 VITALS — BP 99/68 | HR 74 | Temp 98.1°F | Resp 16 | Ht 64.0 in | Wt 285.4 lb

## 2024-06-21 DIAGNOSIS — K429 Umbilical hernia without obstruction or gangrene: Secondary | ICD-10-CM | POA: Diagnosis not present

## 2024-06-21 DIAGNOSIS — F410 Panic disorder [episodic paroxysmal anxiety] without agoraphobia: Secondary | ICD-10-CM | POA: Insufficient documentation

## 2024-06-21 DIAGNOSIS — F3160 Bipolar disorder, current episode mixed, unspecified: Secondary | ICD-10-CM | POA: Insufficient documentation

## 2024-06-21 DIAGNOSIS — Z862 Personal history of diseases of the blood and blood-forming organs and certain disorders involving the immune mechanism: Secondary | ICD-10-CM | POA: Insufficient documentation

## 2024-06-21 DIAGNOSIS — F419 Anxiety disorder, unspecified: Secondary | ICD-10-CM | POA: Insufficient documentation

## 2024-06-21 DIAGNOSIS — J4520 Mild intermittent asthma, uncomplicated: Secondary | ICD-10-CM | POA: Insufficient documentation

## 2024-06-21 DIAGNOSIS — E782 Mixed hyperlipidemia: Secondary | ICD-10-CM | POA: Insufficient documentation

## 2024-06-21 DIAGNOSIS — F32A Depression, unspecified: Secondary | ICD-10-CM | POA: Insufficient documentation

## 2024-06-21 DIAGNOSIS — J4530 Mild persistent asthma, uncomplicated: Secondary | ICD-10-CM | POA: Insufficient documentation

## 2024-06-21 DIAGNOSIS — G43009 Migraine without aura, not intractable, without status migrainosus: Secondary | ICD-10-CM | POA: Insufficient documentation

## 2024-06-21 DIAGNOSIS — Z6841 Body Mass Index (BMI) 40.0 and over, adult: Secondary | ICD-10-CM | POA: Insufficient documentation

## 2024-06-21 DIAGNOSIS — F331 Major depressive disorder, recurrent, moderate: Secondary | ICD-10-CM | POA: Insufficient documentation

## 2024-06-21 DIAGNOSIS — G8929 Other chronic pain: Secondary | ICD-10-CM | POA: Insufficient documentation

## 2024-06-21 DIAGNOSIS — Z8669 Personal history of other diseases of the nervous system and sense organs: Secondary | ICD-10-CM | POA: Insufficient documentation

## 2024-06-24 NOTE — H&P (Signed)
 [name] History and Physical  Reason for Referral: Umbilical hernia reschedule surgery Referring Physician: Dr. Katrinka  Chief Complaint   Sherry Cole     Sherry Cole is a 53 y.o. female.  HPI: Patient being evaluated for an umbilical hernia and to reschedule her surgery.  She was previously scheduled for robotic assisted laparoscopic umbilical hernia repair with mesh on 07/22/2023, however she was noted to be positive for cocaine on urine drug test, which resulted in cancellation of her surgery.  Her umbilical hernia has been present for at least 7 years, and she first noticed it when she was pregnant 7 years ago.  She was supposed to undergo a hernia repair at the time of her C-section, however secondary to preeclampsia, the hernia was not repaired at the time of C-section.  Since last year, her hernia has slowly been increasing in size.  She has occasional discomfort associated with it, but otherwise has been doing well related to the hernia.  She denies nausea and vomiting.  She is having regular bowel movements.  Her past medical history is significant for neuropathy, diabetes, depression, bipolar disorder, schizophrenia, hyperlipidemia, joint pain, and restless leg syndrome.  She denies use of blood thinning medications.  Her surgical history is significant for 2 C-sections.  She denies use of tobacco products, alcohol, and illicit drugs.  Past Medical History:  Diagnosis Date   Asthma    Bipolar 1 disorder (HCC)    Depression    Diabetes mellitus without complication (HCC)    History of kidney stones    Hydrocephalus (HCC)    Migraine    Paranoid schizophrenia (HCC)    Personality disorder (HCC)    Psychotic disorder (HCC)    PTSD (post-traumatic stress disorder)    Schwannoma     Past Surgical History:  Procedure Laterality Date   CESAREAN SECTION     x2   TUBAL LIGATION      Family History  Problem Relation Age of Onset   Heart disease  Maternal Grandmother    Cancer Maternal Grandmother    Hyperlipidemia Maternal Grandmother    COPD Maternal Grandmother    Hyperlipidemia Maternal Grandfather    Heart disease Mother     Social History   Tobacco Use   Smoking status: Never   Smokeless tobacco: Never  Vaping Use   Vaping status: Never Used  Substance Use Topics   Alcohol use: No   Drug use: Not Currently    Types: Cocaine, Marijuana    Comment: pt denies    Medications: I have reviewed the patient's current medications. Allergies as of 06/21/2024       Reactions   Metformin Hcl Diarrhea   Other Other (See Comments)   Lillies and roses-migraines Mammal dander-sneezing and asthma   Valacyclovir Hives        Medication List        Accurate as of June 21, 2024 11:59 PM. If you have any questions, ask your nurse or doctor.          STOP taking these medications    metroNIDAZOLE  500 MG tablet Commonly known as: FLAGYL  Stopped by: Sherry Cole       TAKE these medications    acetaminophen  500 MG tablet Commonly known as: TYLENOL  Take 500-1,000 mg by mouth every 6 (six) hours as needed (pain.).   Advair Diskus 100-50 MCG/ACT Aepb Generic drug: fluticasone -salmeterol 1 puff.   albuterol  108 (90 Base) MCG/ACT inhaler Commonly known as: VENTOLIN   HFA Inhale 2 puffs into the lungs every 4 (four) hours as needed for wheezing or shortness of breath.   Albuterol  Sulfate 2.5 MG/0.5ML Nebu Every 4 hours as needed for shortness of breath Inhalation; Duration: 30 days   atorvastatin 40 MG tablet Commonly known as: LIPITOR Take 40 mg by mouth daily.   B-complex with vitamin C tablet Take 1 tablet by mouth daily.   benzonatate 100 MG capsule Commonly known as: TESSALON 1 capsule as needed Orally Three times a day; Duration: 7 days As needed for cough   BLOOD GLUCOSE TEST STRIPS Strp use twice a day to check blook sugars In Vitro twice a day; Duration: 30 days   Contour Blood  Glucose System w/Device Kit as directed; Duration: 30 days   diphenhydrAMINE  25 MG tablet Commonly known as: BENADRYL  Take 25 mg by mouth every 6 (six) hours as needed (animal allergies.).   ferrous sulfate 325 (65 FE) MG tablet Take 325 mg by mouth daily.   FLUoxetine  20 MG capsule Commonly known as: PROZAC  Take 20 mg by mouth at bedtime.   furosemide  20 MG tablet Commonly known as: LASIX  Take 1 tablet (20 mg total) by mouth daily.   gabapentin 400 MG capsule Commonly known as: NEURONTIN Take 400 mg by mouth 3 (three) times daily.   guanFACINE 1 MG tablet Commonly known as: TENEX Take 1 mg by mouth at bedtime.   hydrOXYzine  50 MG capsule Commonly known as: VISTARIL  TAKE ONE CAPSULE BY MOUTH THREE TIMES DAILY AS NEEDED FOR ANXIETY for 30 days   Lancets Super Thin Misc as directed; Duration: 30 days   multivitamin with minerals Tabs tablet Take 1 tablet by mouth daily.   Ozempic (2 MG/DOSE) 8 MG/3ML Sopn Generic drug: Semaglutide (2 MG/DOSE) Inject 2 mg into the skin once a week. What changed: Another medication with the same name was removed. Continue taking this medication, and follow the directions you see here. Changed by: Sherry Cole   potassium chloride  SA 20 MEQ tablet Commonly known as: KLOR-CON  M Take 1 tablet (20 mEq total) by mouth 2 (two) times daily.   PROBIOTIC PO Take 1 tablet by mouth daily. Gummy   progesterone  200 MG capsule Commonly known as: Prometrium  Take 1 capsule (200 mg total) by mouth daily.   traZODone 100 MG tablet Commonly known as: DESYREL Take 100 mg by mouth at bedtime as needed for sleep.   VITAMIN D3 PO Take 1 tablet by mouth daily.   Vraylar 3 MG capsule Generic drug: cariprazine Take by mouth daily.         ROS:  Constitutional: negative for chills, fatigue, and fevers Eyes: negative for visual disturbance and pain Ears, nose, mouth, throat, and face: negative for ear drainage, sore throat, and sinus  problems Respiratory: negative for cough, wheezing, and shortness of breath Cardiovascular: negative for chest pain and palpitations Gastrointestinal: negative for abdominal pain, nausea, reflux symptoms, and vomiting Genitourinary:negative for dysuria and frequency Integument/breast: negative for dryness and rash Hematologic/lymphatic: negative for bleeding and lymphadenopathy Musculoskeletal:negative for back pain and neck pain Neurological: negative for dizziness and tremors Endocrine: negative for temperature intolerance  Blood pressure 99/68, pulse 74, temperature 98.1 F (36.7 C), temperature source Oral, resp. rate 16, height 5' 4 (1.626 m), weight 285 lb 6.4 oz (129.5 kg), last menstrual period 05/31/2024, SpO2 93%. Physical Exam Vitals reviewed.  Constitutional:      Appearance: Normal appearance.  HENT:     Head: Normocephalic and atraumatic.  Eyes:  Extraocular Movements: Extraocular movements intact.     Pupils: Pupils are equal, round, and reactive to light.  Cardiovascular:     Rate and Rhythm: Normal rate and regular rhythm.  Pulmonary:     Effort: Pulmonary effort is normal.     Breath sounds: Normal breath sounds.  Abdominal:     Comments: Abdomen soft, nondistended, no percussion tenderness, nontender to palpation; no rigidity, guarding, rebound tenderness; partially reducible umbilical hernia, minimal tenderness to palpation  Musculoskeletal:        General: Normal range of motion.     Cervical back: Normal range of motion.  Skin:    General: Skin is warm and dry.  Neurological:     General: No focal deficit present.     Mental Status: She is alert and oriented to person, place, and time.  Psychiatric:        Mood and Affect: Mood normal.        Behavior: Behavior normal.     Results: No results found for this or any previous Cole (from the past 48 hours).  No results found.   Assessment & Plan:  Sherry Cole is a 53 y.o. female who  presents for evaluation of an umbilical hernia.  -I discussed the pathophysiology of umbilical hernias and we discussed the recommendations for surgical repair -The risk and benefits of robotic assisted laparoscopic umbilical hernia repair with mesh were discussed including but not limited to bleeding, infection, injury to surrounding structures, need for additional procedures, and hernia recurrence.  After careful consideration, Sherry Cole has decided to proceed with surgery.  -Patient tentatively scheduled for surgery on 9/3 -Information provided to the patient regarding umbilical hernia -Advised that the patient should present to the ED if they begin to have painful nonreducible bulge at her umbilicus, nausea, vomiting, and obstipation  All questions were answered to the satisfaction of the patient.  Note: Portions of this report may have been transcribed using voice recognition software. Every effort has been made to ensure accuracy; however, inadvertent computerized transcription errors may still be present.   Sherry Brittle, DO [facility] [address] [place], [place] [address] [phone] (office)

## 2024-06-24 NOTE — Progress Notes (Signed)
 [name] History and Physical  Reason for Referral: Umbilical hernia reschedule surgery Referring Physician: Dr. Katrinka  Chief Complaint   Sherry Cole     Sherry Cole is a 53 y.o. female.  HPI: Patient being evaluated for an umbilical hernia and to reschedule her surgery.  She was previously scheduled for robotic assisted laparoscopic umbilical hernia repair with mesh on 07/22/2023, however she was noted to be positive for cocaine on urine drug test, which resulted in cancellation of her surgery.  Her umbilical hernia has been present for at least 7 years, and she first noticed it when she was pregnant 7 years ago.  She was supposed to undergo a hernia repair at the time of her C-section, however secondary to preeclampsia, the hernia was not repaired at the time of C-section.  Since last year, her hernia has slowly been increasing in size.  She has occasional discomfort associated with it, but otherwise has been doing well related to the hernia.  She denies nausea and vomiting.  She is having regular bowel movements.  Her past medical history is significant for neuropathy, diabetes, depression, bipolar disorder, schizophrenia, hyperlipidemia, joint pain, and restless leg syndrome.  She denies use of blood thinning medications.  Her surgical history is significant for 2 C-sections.  She denies use of tobacco products, alcohol, and illicit drugs.  Past Medical History:  Diagnosis Date   Asthma    Bipolar 1 disorder (HCC)    Depression    Diabetes mellitus without complication (HCC)    History of kidney stones    Hydrocephalus (HCC)    Migraine    Paranoid schizophrenia (HCC)    Personality disorder (HCC)    Psychotic disorder (HCC)    PTSD (post-traumatic stress disorder)    Schwannoma     Past Surgical History:  Procedure Laterality Date   CESAREAN SECTION     x2   TUBAL LIGATION      Family History  Problem Relation Age of Onset   Heart disease  Maternal Grandmother    Cancer Maternal Grandmother    Hyperlipidemia Maternal Grandmother    COPD Maternal Grandmother    Hyperlipidemia Maternal Grandfather    Heart disease Mother     Social History   Tobacco Use   Smoking status: Never   Smokeless tobacco: Never  Vaping Use   Vaping status: Never Used  Substance Use Topics   Alcohol use: No   Drug use: Not Currently    Types: Cocaine, Marijuana    Comment: pt denies    Medications: I have reviewed the patient's current medications. Allergies as of 06/21/2024       Reactions   Metformin Hcl Diarrhea   Other Other (See Comments)   Lillies and roses-migraines Mammal dander-sneezing and asthma   Valacyclovir Hives        Medication List        Accurate as of June 21, 2024 11:59 PM. If you have any questions, ask your nurse or doctor.          STOP taking these medications    metroNIDAZOLE  500 MG tablet Commonly known as: FLAGYL  Stopped by: Sherry Cole A Jaylenne Hamelin       TAKE these medications    acetaminophen  500 MG tablet Commonly known as: TYLENOL  Take 500-1,000 mg by mouth every 6 (six) hours as needed (pain.).   Advair Diskus 100-50 MCG/ACT Aepb Generic drug: fluticasone -salmeterol 1 puff.   albuterol  108 (90 Base) MCG/ACT inhaler Commonly known as: VENTOLIN   HFA Inhale 2 puffs into the lungs every 4 (four) hours as needed for wheezing or shortness of breath.   Albuterol  Sulfate 2.5 MG/0.5ML Nebu Every 4 hours as needed for shortness of breath Inhalation; Duration: 30 days   atorvastatin 40 MG tablet Commonly known as: LIPITOR Take 40 mg by mouth daily.   B-complex with vitamin C tablet Take 1 tablet by mouth daily.   benzonatate 100 MG capsule Commonly known as: TESSALON 1 capsule as needed Orally Three times a day; Duration: 7 days As needed for cough   BLOOD GLUCOSE TEST STRIPS Strp use twice a day to check blook sugars In Vitro twice a day; Duration: 30 days   Contour Blood  Glucose System w/Device Kit as directed; Duration: 30 days   diphenhydrAMINE  25 MG tablet Commonly known as: BENADRYL  Take 25 mg by mouth every 6 (six) hours as needed (animal allergies.).   ferrous sulfate 325 (65 FE) MG tablet Take 325 mg by mouth daily.   FLUoxetine  20 MG capsule Commonly known as: PROZAC  Take 20 mg by mouth at bedtime.   furosemide  20 MG tablet Commonly known as: LASIX  Take 1 tablet (20 mg total) by mouth daily.   gabapentin 400 MG capsule Commonly known as: NEURONTIN Take 400 mg by mouth 3 (three) times daily.   guanFACINE 1 MG tablet Commonly known as: TENEX Take 1 mg by mouth at bedtime.   hydrOXYzine  50 MG capsule Commonly known as: VISTARIL  TAKE ONE CAPSULE BY MOUTH THREE TIMES DAILY AS NEEDED FOR ANXIETY for 30 days   Lancets Super Thin Misc as directed; Duration: 30 days   multivitamin with minerals Tabs tablet Take 1 tablet by mouth daily.   Ozempic (2 MG/DOSE) 8 MG/3ML Sopn Generic drug: Semaglutide (2 MG/DOSE) Inject 2 mg into the skin once a week. What changed: Another medication with the same name was removed. Continue taking this medication, and follow the directions you see here. Changed by: Jearldine Cassady A Gustin Zobrist   potassium chloride  SA 20 MEQ tablet Commonly known as: KLOR-CON  M Take 1 tablet (20 mEq total) by mouth 2 (two) times daily.   PROBIOTIC PO Take 1 tablet by mouth daily. Gummy   progesterone  200 MG capsule Commonly known as: Prometrium  Take 1 capsule (200 mg total) by mouth daily.   traZODone 100 MG tablet Commonly known as: DESYREL Take 100 mg by mouth at bedtime as needed for sleep.   VITAMIN D3 PO Take 1 tablet by mouth daily.   Vraylar 3 MG capsule Generic drug: cariprazine Take by mouth daily.         ROS:  Constitutional: negative for chills, fatigue, and fevers Eyes: negative for visual disturbance and pain Ears, nose, mouth, throat, and face: negative for ear drainage, sore throat, and sinus  problems Respiratory: negative for cough, wheezing, and shortness of breath Cardiovascular: negative for chest pain and palpitations Gastrointestinal: negative for abdominal pain, nausea, reflux symptoms, and vomiting Genitourinary:negative for dysuria and frequency Integument/breast: negative for dryness and rash Hematologic/lymphatic: negative for bleeding and lymphadenopathy Musculoskeletal:negative for back pain and neck pain Neurological: negative for dizziness and tremors Endocrine: negative for temperature intolerance  Blood pressure 99/68, pulse 74, temperature 98.1 F (36.7 C), temperature source Oral, resp. rate 16, height 5' 4 (1.626 m), weight 285 lb 6.4 oz (129.5 kg), last menstrual period 05/31/2024, SpO2 93%. Physical Exam Vitals reviewed.  Constitutional:      Appearance: Normal appearance.  HENT:     Head: Normocephalic and atraumatic.  Eyes:  Extraocular Movements: Extraocular movements intact.     Pupils: Pupils are equal, round, and reactive to light.  Cardiovascular:     Rate and Rhythm: Normal rate and regular rhythm.  Pulmonary:     Effort: Pulmonary effort is normal.     Breath sounds: Normal breath sounds.  Abdominal:     Comments: Abdomen soft, nondistended, no percussion tenderness, nontender to palpation; no rigidity, guarding, rebound tenderness; partially reducible umbilical hernia, minimal tenderness to palpation  Musculoskeletal:        General: Normal range of motion.     Cervical back: Normal range of motion.  Skin:    General: Skin is warm and dry.  Neurological:     General: No focal deficit present.     Mental Status: She is alert and oriented to person, place, and time.  Psychiatric:        Mood and Affect: Mood normal.        Behavior: Behavior normal.     Results: No results found for this or any previous Cole (from the past 48 hours).  No results found.   Assessment & Plan:  Nekeshia Lenhardt is a 53 y.o. female who  presents for evaluation of an umbilical hernia.  -I discussed the pathophysiology of umbilical hernias and we discussed the recommendations for surgical repair -The risk and benefits of robotic assisted laparoscopic umbilical hernia repair with mesh were discussed including but not limited to bleeding, infection, injury to surrounding structures, need for additional procedures, and hernia recurrence.  After careful consideration, Margareth Kanner has decided to proceed with surgery.  -Patient tentatively scheduled for surgery on 9/3 -Information provided to the patient regarding umbilical hernia -Advised that the patient should present to the ED if they begin to have painful nonreducible bulge at her umbilicus, nausea, vomiting, and obstipation  All questions were answered to the satisfaction of the patient.  Note: Portions of this report may have been transcribed using voice recognition software. Every effort has been made to ensure accuracy; however, inadvertent computerized transcription errors may still be present.   Dorothyann Brittle, DO [facility] [address] [place], [place] [address] [phone] (office)

## 2024-07-07 NOTE — Patient Instructions (Signed)
 Sherry Cole  07/07/2024     @PREFPERIOPPHARMACY @   Your procedure is scheduled on 07/13/2024.   Report to [facility] at  0800  A.M.    Call this number if you have problems the morning of surgery:  [phone]  If you experience any cold or flu symptoms such as cough, fever, chills, shortness of breath, etc. between now and your scheduled surgery, please notify us  at the above number.   Remember:   DO NOT take any medications for diabetes the morning of your procedure.   Do not eat after midnight.    You may drink clear liquids until 0600 am on 07/13/2024.     Clear liquids allowed are:                    Water, Juice (No red color; non-citric and without pulp; diabetics please choose diet or no sugar options), Carbonated beverages (diabetics please choose diet or no sugar options), Clear Tea (No creamer, milk, or cream, including half & half and powdered creamer), Black Coffee Only (No creamer, milk or cream, including half & half and powdered creamer), and Clear Sports drink (No red color; diabetics please choose diet or no sugar options)    Take these medicines the morning of surgery with A SIP OF WATER                                  fluoxetine , hydroxyzine , vraylar.    Do not wear jewelry, make-up or nail polish, including gel polish,  artificial nails, or any other type of covering on natural nails (fingers and  toes).  Do not wear lotions, powders, or perfumes, or deodorant.  Do not shave 48 hours prior to surgery.  Men may shave face and neck.  Do not bring valuables to the hospital.  [facility] is not responsible for any belongings or valuables.  Contacts, dentures or bridgework may not be worn into surgery.  Leave your suitcase in the car.  After surgery it may be brought to your room.  For patients admitted to the hospital, discharge time will be determined by your treatment team.  Patients discharged the day of surgery will not be allowed to  drive home and must have someone with them for 24 hours.    Special instructions:   DO NOT smoke tobacco or vape for 24 hours before your procedure.  Please read over the following fact sheets that you were given. Coughing and Deep Breathing, Surgical Site Infection Prevention, Anesthesia Post-op Instructions, and Care and Recovery After Surgery      Laparoscopic Surgery for Belly Hernias: What to Know After After the procedure, it's common to have pain, discomfort, or soreness. Follow these instructions at home: Medicines Take your medicines only as told. You may need to take steps to help treat or prevent trouble pooping (constipation), such as: Taking medicines to help you poop. Eating foods high in fiber, like beans, whole grains, and fresh fruits and vegetables. Drinking more fluids as told. Ask your health care provider if it's safe to drive or use machines while taking your medicine. Incision care  Take care of the cuts in your belly as told. Make sure you: Wash your hands with soap and water for at least 20 seconds before and after you change your bandage. If you can't use soap and water, use hand sanitizer.  Change your bandage. Leave stitches or skin glue alone. Leave tape strips alone unless you're told to take them off. You may trim the edges of the tape strips if they curl up. Check the cuts on your belly every day for signs of infection. Check for: More redness, swelling, or pain. More fluid or blood. Warmth. Pus or a bad smell. Activity Rest as told. Get up to take short walks at least every 2 hours during the day. This helps you breathe better and keeps your blood flowing. Ask for help if you feel weak or unsteady. Do not take baths, swim, or use a hot tub until you're told it's OK. Ask if you can shower. Ask if it's OK for you to lift. If you were given a sedative, do not drive or use machines until you're told it's safe. A sedative can make you sleepy. Ask what  things are safe for you to do at home. Ask when you can go back to work or school. General instructions Hold a pillow over your belly when you cough or sneeze. This helps with pain. Wear a binder around your belly as told by your provider. Do not smoke, vape, or use nicotine or tobacco. Wear compression stockings to reduce swelling and help prevent blood clots in your legs. You may be asked to continue to do deep breathing exercises at home. This will help to prevent a lung infection. Contact a health care provider if: You have any signs of infection. You have pain that gets worse or does not get better with medicine. You throw up or you feel like throwing up. You have a cough. You have not pooped in 3 days. You are not able to pee. You have a fever. Get help right away if: You have very bad pain in your belly. You throw up every time you eat or drink. You have redness, warmth, or pain in your leg. You have chest pain. You have trouble breathing. These symptoms may be an emergency. Call 911 right away. Do not wait to see if the symptoms will go away. Do not drive yourself to the hospital. This information is not intended to replace advice given to you by your health care provider. Make sure you discuss any questions you have with your health care provider. Document Revised: 05/05/2023 Document Reviewed: 05/05/2023 Elsevier Patient Education  2024 Elsevier Inc.General Anesthesia, Adult, Care After The following information offers guidance on how to care for yourself after your procedure. Your health care provider may also give you more specific instructions. If you have problems or questions, contact your health care provider. What can I expect after the procedure? After the procedure, it is common for people to: Have pain or discomfort at the IV site. Have nausea or vomiting. Have a sore throat or hoarseness. Have trouble concentrating. Feel cold or chills. Feel weak, sleepy, or  tired (fatigue). Have soreness and body aches. These can affect parts of the body that were not involved in surgery. Follow these instructions at home: For the time period you were told by your health care provider:  Rest. Do not participate in activities where you could fall or become injured. Do not drive or use machinery. Do not drink alcohol. Do not take sleeping pills or medicines that cause drowsiness. Do not make important decisions or sign legal documents. Do not take care of children on your own. General instructions Drink enough fluid to keep your urine pale yellow. If you have sleep apnea, surgery  and certain medicines can increase your risk for breathing problems. Follow instructions from your health care provider about wearing your sleep device: Anytime you are sleeping, including during daytime naps. While taking prescription pain medicines, sleeping medicines, or medicines that make you drowsy. Return to your normal activities as told by your health care provider. Ask your health care provider what activities are safe for you. Take over-the-counter and prescription medicines only as told by your health care provider. Do not use any products that contain nicotine or tobacco. These products include cigarettes, chewing tobacco, and vaping devices, such as e-cigarettes. These can delay incision healing after surgery. If you need help quitting, ask your health care provider. Contact a health care provider if: You have nausea or vomiting that does not get better with medicine. You vomit every time you eat or drink. You have pain that does not get better with medicine. You cannot urinate or have bloody urine. You develop a skin rash. You have a fever. Get help right away if: You have trouble breathing. You have chest pain. You vomit blood. These symptoms may be an emergency. Get help right away. Call 911. Do not wait to see if the symptoms will go away. Do not drive yourself  to the hospital. Summary After the procedure, it is common to have a sore throat, hoarseness, nausea, vomiting, or to feel weak, sleepy, or fatigue. For the time period you were told by your health care provider, do not drive or use machinery. Get help right away if you have difficulty breathing, have chest pain, or vomit blood. These symptoms may be an emergency. This information is not intended to replace advice given to you by your health care provider. Make sure you discuss any questions you have with your health care provider. Document Revised: 01/24/2022 Document Reviewed: 01/24/2022 Elsevier Patient Education  2024 Elsevier Inc.How to Use Chlorhexidine  at Home in the Shower Chlorhexidine  gluconate (CHG) is a germ-killing (antiseptic) wash that's used to clean the skin. It can get rid of the germs that normally live on the skin and can keep them away for about 24 hours. If you're having surgery, you may be told to shower with CHG at home the night before surgery. This can help lower your risk for infection. To use CHG wash in the shower, follow the steps below. Supplies needed: CHG body wash. Clean washcloth. Clean towel. How to use CHG in the shower Follow these steps unless you're told to use CHG in a different way: Start the shower. Use your normal soap and shampoo to wash your face and hair. Turn off the shower or move out of the shower stream. Pour CHG onto a clean washcloth. Do not use any type of brush or rough sponge. Start at your neck, washing your body down to your toes. Make sure you: Wash the part of your body where the surgery will be done for at least 1 minute. Do not scrub. Do not use CHG on your head or face unless your health care provider tells you to. If it gets into your ears or eyes, rinse them well with water. Do not wash your genitals with CHG. Wash your back and under your arms. Make sure to wash skin folds. Let the CHG sit on your skin for 1-2 minutes or as  long as told. Rinse your entire body in the shower, including all body creases and folds. Turn off the shower. Dry off with a clean towel. Do not put anything on  your skin afterward, such as powder, lotion, or perfume. Put on clean clothes or pajamas. If it's the night before surgery, sleep in clean sheets. General tips Use CHG only as told, and follow the instructions on the label. Use the full amount of CHG as told. This is often one bottle. Do not smoke and stay away from flames after using CHG. Your skin may feel sticky after using CHG. This is normal. The sticky feeling will go away as the CHG dries. Do not use CHG: If you have a chlorhexidine  allergy or have reacted to chlorhexidine  in the past. On open wounds or areas of skin that have broken skin, cuts, or scrapes. On babies younger than 62 months of age. Contact a health care provider if: You have questions about using CHG. Your skin gets irritated or itchy. You have a rash after using CHG. You swallow any CHG. Call your local poison control center [phone] in the [place] Your eyes itch badly, or they become very red or swollen. Your hearing changes. You have trouble seeing. If you can't reach your provider, go to an urgent care or emergency room. Do not drive yourself. Get help right away if: You have swelling or tingling in your mouth or throat. You make high-pitched whistling sounds when you breathe, most often when you breathe out (wheeze). You have trouble breathing. These symptoms may be an emergency. Call 911 right away. Do not wait to see if the symptoms will go away. Do not drive yourself to the hospital. This information is not intended to replace advice given to you by your health care provider. Make sure you discuss any questions you have with your health care provider. Document Revised: 05/12/2023 Document Reviewed: 05/08/2022 Elsevier Patient Education  2024 [employer].

## 2024-07-08 ENCOUNTER — Other Ambulatory Visit: Payer: Self-pay

## 2024-07-08 ENCOUNTER — Encounter (HOSPITAL_COMMUNITY)
Admission: RE | Admit: 2024-07-08 | Discharge: 2024-07-08 | Disposition: A | Discharge status: Home / Self Care | Source: Ambulatory Visit | Attending: Surgery | Admitting: Surgery

## 2024-07-08 ENCOUNTER — Encounter (HOSPITAL_COMMUNITY): Payer: Self-pay

## 2024-07-08 VITALS — BP 99/68 | HR 75 | Resp 18 | Ht 64.0 in | Wt 285.4 lb

## 2024-07-08 DIAGNOSIS — D649 Anemia, unspecified: Secondary | ICD-10-CM | POA: Diagnosis not present

## 2024-07-08 DIAGNOSIS — E119 Type 2 diabetes mellitus without complications: Secondary | ICD-10-CM | POA: Insufficient documentation

## 2024-07-08 DIAGNOSIS — Z01812 Encounter for preprocedural laboratory examination: Secondary | ICD-10-CM | POA: Diagnosis present

## 2024-07-08 DIAGNOSIS — Z01818 Encounter for other preprocedural examination: Secondary | ICD-10-CM

## 2024-07-08 DIAGNOSIS — F1410 Cocaine abuse, uncomplicated: Secondary | ICD-10-CM | POA: Insufficient documentation

## 2024-07-08 LAB — CBC WITH DIFFERENTIAL/PLATELET
Abs Immature Granulocytes: 0.04 K/uL (ref 0.00–0.07)
Basophils Absolute: 0.1 K/uL (ref 0.0–0.1)
Basophils Relative: 1 %
Eosinophils Absolute: 0.4 K/uL (ref 0.0–0.5)
Eosinophils Relative: 4 %
HCT: 39 % (ref 36.0–46.0)
Hemoglobin: 13 g/dL (ref 12.0–15.0)
Immature Granulocytes: 0 %
Lymphocytes Relative: 33 %
Lymphs Abs: 3.4 K/uL (ref 0.7–4.0)
MCH: 28.8 pg (ref 26.0–34.0)
MCHC: 33.3 g/dL (ref 30.0–36.0)
MCV: 86.5 fL (ref 80.0–100.0)
Monocytes Absolute: 0.9 K/uL (ref 0.1–1.0)
Monocytes Relative: 9 %
Neutro Abs: 5.5 K/uL (ref 1.7–7.7)
Neutrophils Relative %: 53 %
Platelets: 304 K/uL (ref 150–400)
RBC: 4.51 MIL/uL (ref 3.87–5.11)
RDW: 12.9 % (ref 11.5–15.5)
WBC: 10.3 K/uL (ref 4.0–10.5)
nRBC: 0 % (ref 0.0–0.2)

## 2024-07-08 LAB — RAPID URINE DRUG SCREEN, HOSP PERFORMED
Amphetamines: NOT DETECTED
Barbiturates: NOT DETECTED
Benzodiazepines: NOT DETECTED
Cocaine: NOT DETECTED
Opiates: NOT DETECTED
Tetrahydrocannabinol: NOT DETECTED

## 2024-07-08 LAB — BASIC METABOLIC PANEL WITH GFR
Anion gap: 13 (ref 5–15)
BUN: 11 mg/dL (ref 6–20)
CO2: 21 mmol/L — ABNORMAL LOW (ref 22–32)
Calcium: 9.3 mg/dL (ref 8.9–10.3)
Chloride: 102 mmol/L (ref 98–111)
Creatinine, Ser: 0.72 mg/dL (ref 0.44–1.00)
GFR, Estimated: >60 mL/min (ref >60)
Glucose, Bld: 211 mg/dL — ABNORMAL HIGH (ref 70–99)
Potassium: 3.8 mmol/L (ref 3.5–5.1)
Sodium: 136 mmol/L (ref 135–145)

## 2024-07-08 LAB — PREGNANCY, URINE: Preg Test, Ur: NEGATIVE

## 2024-07-08 NOTE — Progress Notes (Signed)
 Billy Tye DEL, RN  Six, Tawni DEL, LPN; Rhenda Luke GAILS, RN Patient had PAT today for surgery on 9/3 with Dr.Pappayliou. patient took her Ozempic on 07/07/24. Per Dr.Kiel patient needs to hold this 7 days so she needs to be rescheduled. Dr.Pappayliou is aware. Patient is also aware. Thanks! Tye Billy, RN

## 2024-07-10 LAB — HEMOGLOBIN A1C
Hgb A1c MFr Bld: 7.8 % — ABNORMAL HIGH (ref 4.8–5.6)
Mean Plasma Glucose: 177 mg/dL

## 2024-07-12 ENCOUNTER — Other Ambulatory Visit (HOSPITAL_COMMUNITY)

## 2024-07-19 ENCOUNTER — Telehealth: Payer: Self-pay | Admitting: Primary Care

## 2024-07-19 NOTE — Telephone Encounter (Signed)
 Copied from CRM #[id number]. Topic: Appointments - Scheduling Inquiry for Clinic >> Jul 15, 2024  5:37 PM Rozanna MATSU wrote: Reason for CRM: pt called needing sleep study rescheduled

## 2024-07-19 NOTE — Telephone Encounter (Signed)
 Pt has been rescheuled and made aware [facility]

## 2024-07-27 ENCOUNTER — Encounter (HOSPITAL_COMMUNITY)
Admission: RE | Admit: 2024-07-27 | Discharge: 2024-07-27 | Disposition: A | Discharge status: Home / Self Care | Source: Ambulatory Visit | Attending: Surgery | Admitting: Surgery

## 2024-07-27 ENCOUNTER — Encounter (HOSPITAL_COMMUNITY): Payer: Self-pay

## 2024-07-27 DIAGNOSIS — Z01818 Encounter for other preprocedural examination: Secondary | ICD-10-CM

## 2024-07-27 HISTORY — DX: Nausea with vomiting, unspecified: R11.2

## 2024-07-27 HISTORY — DX: Other specified postprocedural states: Z98.890

## 2024-07-28 ENCOUNTER — Encounter

## 2024-07-28 NOTE — Progress Notes (Signed)
 Pt called stating I have a cough and sinus drainage started a few days ago.   Spoke with Dr. Kendell he said as long as she has no fever she can come in.  If she develops a fever to call dr office to reschedule, Pt verbalized understandingl

## 2024-07-29 ENCOUNTER — Telehealth: Payer: Self-pay | Admitting: *Deleted

## 2024-07-29 ENCOUNTER — Ambulatory Visit (HOSPITAL_COMMUNITY): Admission: RE | Admit: 2024-07-29 | Source: Home / Self Care | Admitting: Surgery

## 2024-07-29 ENCOUNTER — Encounter (HOSPITAL_COMMUNITY): Admission: RE | Payer: Self-pay | Source: Home / Self Care

## 2024-07-29 SURGERY — REPAIR, HERNIA, UMBILICAL, ROBOT-ASSISTED
Anesthesia: General

## 2024-07-29 NOTE — Telephone Encounter (Signed)
 Procedure rescheduled.   Of note, case has been rescheduled x4.

## 2024-07-29 NOTE — Telephone Encounter (Signed)
 Procedure: XI ROBOTIC ASSISTED UMBILICAL HERNIA REPAIR W/ MESH, <3 CM CPT: 49591                 Dx: K42.9   Received call from patient [phone]~ telephone.    Patient requested to re-schedule hernia surgery.Reports that she is having car trouble.

## 2024-07-29 NOTE — Progress Notes (Signed)
 Patient called this morning states she is unable to come today for surgery because her car will not crank , when asked if she could come later with another ride patient states no she does not have anyone to bring her. I instructed patient to call Dr.Pappayliou's office when they open to let them know. Pt states she will. Dr.Pappayliou notified and also Montie in office also notified.

## 2024-08-03 ENCOUNTER — Ambulatory Visit

## 2024-08-03 DIAGNOSIS — R0683 Snoring: Secondary | ICD-10-CM

## 2024-08-10 ENCOUNTER — Encounter (HOSPITAL_COMMUNITY): Payer: Self-pay

## 2024-08-10 ENCOUNTER — Encounter (HOSPITAL_COMMUNITY)
Admission: RE | Admit: 2024-08-10 | Discharge: 2024-08-10 | Disposition: A | Discharge status: Home / Self Care | Source: Ambulatory Visit | Attending: Surgery | Admitting: Surgery

## 2024-08-10 ENCOUNTER — Other Ambulatory Visit: Payer: Self-pay

## 2024-08-10 DIAGNOSIS — F1410 Cocaine abuse, uncomplicated: Secondary | ICD-10-CM

## 2024-08-10 DIAGNOSIS — Z01818 Encounter for other preprocedural examination: Secondary | ICD-10-CM

## 2024-08-10 DIAGNOSIS — G4733 Obstructive sleep apnea (adult) (pediatric): Secondary | ICD-10-CM | POA: Diagnosis not present

## 2024-08-12 ENCOUNTER — Encounter (HOSPITAL_COMMUNITY)
Admission: RE | Disposition: A | Discharge status: Home / Self Care | Payer: Self-pay | Source: Home / Self Care | Attending: Surgery

## 2024-08-12 ENCOUNTER — Ambulatory Visit (HOSPITAL_COMMUNITY): Payer: Self-pay | Admitting: Anesthesiology

## 2024-08-12 ENCOUNTER — Ambulatory Visit (HOSPITAL_COMMUNITY)
Admission: RE | Admit: 2024-08-12 | Discharge: 2024-08-12 | Disposition: A | Discharge status: Home / Self Care | Attending: Surgery | Admitting: Surgery

## 2024-08-12 ENCOUNTER — Other Ambulatory Visit: Payer: Self-pay

## 2024-08-12 ENCOUNTER — Encounter (HOSPITAL_COMMUNITY): Payer: Self-pay | Admitting: Surgery

## 2024-08-12 ENCOUNTER — Encounter (HOSPITAL_COMMUNITY): Payer: Self-pay | Admitting: Anesthesiology

## 2024-08-12 DIAGNOSIS — K219 Gastro-esophageal reflux disease without esophagitis: Secondary | ICD-10-CM | POA: Insufficient documentation

## 2024-08-12 DIAGNOSIS — G4733 Obstructive sleep apnea (adult) (pediatric): Secondary | ICD-10-CM | POA: Diagnosis not present

## 2024-08-12 DIAGNOSIS — Z6841 Body Mass Index (BMI) 40.0 and over, adult: Secondary | ICD-10-CM | POA: Insufficient documentation

## 2024-08-12 DIAGNOSIS — K429 Umbilical hernia without obstruction or gangrene: Secondary | ICD-10-CM

## 2024-08-12 DIAGNOSIS — E119 Type 2 diabetes mellitus without complications: Secondary | ICD-10-CM

## 2024-08-12 DIAGNOSIS — F1410 Cocaine abuse, uncomplicated: Secondary | ICD-10-CM

## 2024-08-12 DIAGNOSIS — G4730 Sleep apnea, unspecified: Secondary | ICD-10-CM | POA: Insufficient documentation

## 2024-08-12 DIAGNOSIS — K420 Umbilical hernia with obstruction, without gangrene: Secondary | ICD-10-CM | POA: Insufficient documentation

## 2024-08-12 DIAGNOSIS — J45909 Unspecified asthma, uncomplicated: Secondary | ICD-10-CM | POA: Insufficient documentation

## 2024-08-12 DIAGNOSIS — E66813 Obesity, class 3: Secondary | ICD-10-CM | POA: Diagnosis not present

## 2024-08-12 DIAGNOSIS — Z01818 Encounter for other preprocedural examination: Secondary | ICD-10-CM

## 2024-08-12 DIAGNOSIS — F418 Other specified anxiety disorders: Secondary | ICD-10-CM

## 2024-08-12 LAB — URINE DRUG SCREEN
Amphetamines: NEGATIVE
Barbiturates: NEGATIVE
Benzodiazepines: NEGATIVE
Cocaine: NEGATIVE
Fentanyl: NEGATIVE
Methadone Scn, Ur: NEGATIVE
Opiates: NEGATIVE
Tetrahydrocannabinol: NEGATIVE

## 2024-08-12 LAB — GLUCOSE, CAPILLARY
Glucose-Capillary: 219 mg/dL — ABNORMAL HIGH (ref 70–99)
Glucose-Capillary: 228 mg/dL — ABNORMAL HIGH (ref 70–99)

## 2024-08-12 LAB — PREGNANCY, URINE: Preg Test, Ur: NEGATIVE

## 2024-08-12 SURGERY — REPAIR, HERNIA, UMBILICAL, ROBOT-ASSISTED
Anesthesia: General | Site: Abdomen

## 2024-08-12 MED ORDER — DEXAMETHASONE SODIUM PHOSPHATE 10 MG/ML IJ SOLN
INTRAMUSCULAR | Status: AC
  Filled 2024-08-12: qty 1

## 2024-08-12 MED ORDER — ONDANSETRON HCL 4 MG/2ML IJ SOLN
INTRAMUSCULAR | Status: AC
  Filled 2024-08-12: qty 2

## 2024-08-12 MED ORDER — LIDOCAINE 2% (20 MG/ML) 5 ML SYRINGE
INTRAMUSCULAR | Status: AC
  Filled 2024-08-12: qty 5

## 2024-08-12 MED ORDER — CHLORHEXIDINE GLUCONATE CLOTH 2 % EX PADS: 6.0000 | MEDICATED_PAD | Freq: Once | CUTANEOUS | Status: DC

## 2024-08-12 MED ORDER — LIDOCAINE 2% (20 MG/ML) 5 ML SYRINGE
INTRAMUSCULAR | Status: DC | PRN
  Administered 2024-08-12 (×2): 100 mg via INTRAVENOUS

## 2024-08-12 MED ORDER — OXYCODONE HCL 5 MG/5ML PO SOLN: 5.0000 mg | Freq: Once | ORAL | Status: AC | PRN

## 2024-08-12 MED ORDER — PHENYLEPHRINE 80 MCG/ML (10ML) SYRINGE FOR IV PUSH (FOR BLOOD PRESSURE SUPPORT)
PREFILLED_SYRINGE | INTRAVENOUS | Status: AC
  Filled 2024-08-12: qty 10

## 2024-08-12 MED ORDER — SUCCINYLCHOLINE CHLORIDE 200 MG/10ML IV SOSY
PREFILLED_SYRINGE | INTRAVENOUS | Status: AC
  Filled 2024-08-12: qty 10

## 2024-08-12 MED ORDER — ORAL CARE MOUTH RINSE: 15.0000 mL | Freq: Once | OROMUCOSAL | Status: AC

## 2024-08-12 MED ORDER — SUCCINYLCHOLINE CHLORIDE 200 MG/10ML IV SOSY
PREFILLED_SYRINGE | INTRAVENOUS | Status: DC | PRN
  Administered 2024-08-12: 200 mg via INTRAVENOUS

## 2024-08-12 MED ORDER — PROPOFOL 500 MG/50ML IV EMUL
INTRAVENOUS | Status: DC | PRN
  Administered 2024-08-12: 25 ug/kg/min via INTRAVENOUS

## 2024-08-12 MED ORDER — BUPIVACAINE HCL (PF) 0.5 % IJ SOLN
INTRAMUSCULAR | Status: AC
  Filled 2024-08-12: qty 30

## 2024-08-12 MED ORDER — PROPOFOL 10 MG/ML IV BOLUS
INTRAVENOUS | Status: DC | PRN
  Administered 2024-08-12: 200 mg via INTRAVENOUS

## 2024-08-12 MED ORDER — DEXMEDETOMIDINE HCL IN NACL 80 MCG/20ML IV SOLN
INTRAVENOUS | Status: DC | PRN
  Administered 2024-08-12 (×3): 10 ug via INTRAVENOUS

## 2024-08-12 MED ORDER — ACETAMINOPHEN 160 MG/5ML PO SOLN
960.0000 mg | Freq: Once | ORAL | Status: AC
  Filled 2024-08-12: qty 30

## 2024-08-12 MED ORDER — CEFAZOLIN SODIUM 1 G IJ SOLR
INTRAMUSCULAR | Status: AC
  Filled 2024-08-12: qty 10

## 2024-08-12 MED ORDER — DEXAMETHASONE SODIUM PHOSPHATE 10 MG/ML IJ SOLN
INTRAMUSCULAR | Status: DC | PRN
  Administered 2024-08-12: 4 mg via INTRAVENOUS

## 2024-08-12 MED ORDER — CHLORHEXIDINE GLUCONATE 0.12 % MT SOLN
15.0000 mL | Freq: Once | OROMUCOSAL | Status: AC
  Administered 2024-08-12: 15 mL via OROMUCOSAL
  Filled 2024-08-12: qty 15

## 2024-08-12 MED ORDER — MIDAZOLAM HCL 2 MG/2ML IJ SOLN
INTRAMUSCULAR | Status: DC | PRN
Start: 2024-08-12 — End: 2024-08-12
  Administered 2024-08-12: 2 mg via INTRAVENOUS

## 2024-08-12 MED ORDER — BUPIVACAINE HCL (PF) 0.5 % IJ SOLN
INTRAMUSCULAR | Status: DC | PRN
  Administered 2024-08-12: 30 mL

## 2024-08-12 MED ORDER — HYDROMORPHONE HCL 1 MG/ML IJ SOLN
0.2500 mg | INTRAMUSCULAR | Status: DC | PRN
  Administered 2024-08-12 (×3): 0.5 mg via INTRAVENOUS
  Filled 2024-08-12 (×3): qty 0.5

## 2024-08-12 MED ORDER — LACTATED RINGERS IV SOLN: INTRAVENOUS | Status: DC

## 2024-08-12 MED ORDER — SODIUM CHLORIDE 0.9 % IV SOLN: 12.5000 mg | INTRAVENOUS | Status: DC | PRN

## 2024-08-12 MED ORDER — ROCURONIUM BROMIDE 10 MG/ML (PF) SYRINGE
PREFILLED_SYRINGE | INTRAVENOUS | Status: AC
  Filled 2024-08-12: qty 10

## 2024-08-12 MED ORDER — ALBUTEROL SULFATE HFA 108 (90 BASE) MCG/ACT IN AERS
INHALATION_SPRAY | RESPIRATORY_TRACT | Status: DC | PRN
  Administered 2024-08-12: 8 via RESPIRATORY_TRACT

## 2024-08-12 MED ORDER — ACETAMINOPHEN 500 MG PO TABS
1000.0000 mg | ORAL_TABLET | Freq: Once | ORAL | Status: AC
  Administered 2024-08-12: 1000 mg via ORAL
  Filled 2024-08-12: qty 2

## 2024-08-12 MED ORDER — OXYCODONE HCL 5 MG PO TABS
5.0000 mg | ORAL_TABLET | Freq: Once | ORAL | Status: AC | PRN
  Administered 2024-08-12: 5 mg via ORAL
  Filled 2024-08-12: qty 1

## 2024-08-12 MED ORDER — FENTANYL CITRATE (PF) 250 MCG/5ML IJ SOLN
INTRAMUSCULAR | Status: AC
  Filled 2024-08-12: qty 5

## 2024-08-12 MED ORDER — ACETAMINOPHEN 500 MG PO TABS: 1000.0000 mg | ORAL_TABLET | Freq: Four times a day (QID) | ORAL | 0 refills | Status: AC

## 2024-08-12 MED ORDER — SCOPOLAMINE 1 MG/3DAYS TD PT72
1.0000 | MEDICATED_PATCH | Freq: Once | TRANSDERMAL | Status: DC
  Administered 2024-08-12: 1 mg via TRANSDERMAL
  Filled 2024-08-12: qty 1

## 2024-08-12 MED ORDER — FENTANYL CITRATE (PF) 250 MCG/5ML IJ SOLN
INTRAMUSCULAR | Status: DC | PRN
  Administered 2024-08-12: 100 ug via INTRAVENOUS
  Administered 2024-08-12: 150 ug via INTRAVENOUS

## 2024-08-12 MED ORDER — ROCURONIUM BROMIDE 10 MG/ML (PF) SYRINGE
PREFILLED_SYRINGE | INTRAVENOUS | Status: DC | PRN
Start: 2024-08-12 — End: 2024-08-12
  Administered 2024-08-12: 20 mg via INTRAVENOUS
  Administered 2024-08-12: 50 mg via INTRAVENOUS
  Administered 2024-08-12: 10 mg via INTRAVENOUS

## 2024-08-12 MED ORDER — DOCUSATE SODIUM 100 MG PO CAPS: 100.0000 mg | ORAL_CAPSULE | Freq: Two times a day (BID) | ORAL | 2 refills | Status: AC

## 2024-08-12 MED ORDER — PROPOFOL 500 MG/50ML IV EMUL
INTRAVENOUS | Status: AC
  Filled 2024-08-12: qty 50

## 2024-08-12 MED ORDER — MIDAZOLAM HCL 2 MG/2ML IJ SOLN
INTRAMUSCULAR | Status: AC
  Filled 2024-08-12: qty 2

## 2024-08-12 MED ORDER — CEFAZOLIN SODIUM-DEXTROSE 2-4 GM/100ML-% IV SOLN
2.0000 g | INTRAVENOUS | Status: AC
  Administered 2024-08-12: 3 g via INTRAVENOUS
  Filled 2024-08-12: qty 100

## 2024-08-12 MED ORDER — ONDANSETRON HCL 4 MG PO TABS: 4.0000 mg | ORAL_TABLET | Freq: Every day | ORAL | 1 refills | Status: DC | PRN

## 2024-08-12 MED ORDER — SUGAMMADEX SODIUM 200 MG/2ML IV SOLN
INTRAVENOUS | Status: DC | PRN
Start: 2024-08-12 — End: 2024-08-12
  Administered 2024-08-12: 400 mg via INTRAVENOUS

## 2024-08-12 MED ORDER — GLYCOPYRROLATE PF 0.2 MG/ML IJ SOSY
PREFILLED_SYRINGE | INTRAMUSCULAR | Status: AC
  Filled 2024-08-12: qty 1

## 2024-08-12 MED ORDER — OXYCODONE HCL 5 MG PO TABS: 5.0000 mg | ORAL_TABLET | Freq: Four times a day (QID) | ORAL | 0 refills | Status: DC | PRN

## 2024-08-12 MED ORDER — GLYCOPYRROLATE PF 0.2 MG/ML IJ SOSY
PREFILLED_SYRINGE | INTRAMUSCULAR | Status: DC | PRN
Start: 2024-08-12 — End: 2024-08-12
  Administered 2024-08-12 (×2): .1 mg via INTRAVENOUS

## 2024-08-12 MED ORDER — ONDANSETRON HCL 4 MG/2ML IJ SOLN
INTRAMUSCULAR | Status: DC | PRN
  Administered 2024-08-12: 4 mg via INTRAVENOUS

## 2024-08-12 MED ORDER — STERILE WATER FOR IRRIGATION IR SOLN
Status: DC | PRN
  Administered 2024-08-12: 500 mL

## 2024-08-12 MED ORDER — ALBUTEROL SULFATE HFA 108 (90 BASE) MCG/ACT IN AERS
INHALATION_SPRAY | RESPIRATORY_TRACT | Status: AC
  Filled 2024-08-12: qty 6.7

## 2024-08-12 SURGICAL SUPPLY — 42 items
BLADE SURG 15 STRL LF DISP TIS (BLADE) ×1 IMPLANT
CHLORAPREP W/TINT 26 (MISCELLANEOUS) ×1 IMPLANT
COVER LIGHT HANDLE (MISCELLANEOUS) IMPLANT
COVER MAYO STAND XLG (MISCELLANEOUS) ×1 IMPLANT
COVER TIP SHEARS 8 DVNC (MISCELLANEOUS) ×1 IMPLANT
DEFOGGER SCOPE WARM SEASHARP (MISCELLANEOUS) ×1 IMPLANT
DERMABOND ADVANCED .7 DNX12 (GAUZE/BANDAGES/DRESSINGS) ×1 IMPLANT
DRAPE ARM DVNC X/XI (DISPOSABLE) ×3 IMPLANT
DRAPE COLUMN DVNC XI (DISPOSABLE) ×1 IMPLANT
DRIVER NDL MEGA SUTCUT DVNCXI (INSTRUMENTS) ×1 IMPLANT
DRIVER NDLE MEGA SUTCUT DVNCXI (INSTRUMENTS) ×1 IMPLANT
ELECTRODE REM PT RTRN 9FT ADLT (ELECTROSURGICAL) ×1 IMPLANT
FORCEPS BPLR 8 MD DVNC XI (FORCEP) ×1 IMPLANT
GLOVE BIOGEL PI IND STRL 6.5 (GLOVE) ×2 IMPLANT
GLOVE BIOGEL PI IND STRL 7.0 (GLOVE) ×2 IMPLANT
GLOVE SURG SS PI 6.5 STRL IVOR (GLOVE) ×2 IMPLANT
GOWN STRL REUS W/TWL LRG LVL3 (GOWN DISPOSABLE) ×3 IMPLANT
GRASPER SUT TROCAR 14GX15 (MISCELLANEOUS) ×1 IMPLANT
KIT TURNOVER KIT A (KITS) ×1 IMPLANT
MANIFOLD NEPTUNE II (INSTRUMENTS) ×1 IMPLANT
MESH VENTRALIGHT ST 4.5IN (Mesh General) IMPLANT
NDL HYPO 21X1.5 SAFETY (NEEDLE) ×1 IMPLANT
NDL INSUFFLATION 14GA 120MM (NEEDLE) ×1 IMPLANT
NEEDLE HYPO 21X1.5 SAFETY (NEEDLE) ×1 IMPLANT
NEEDLE INSUFFLATION 14GA 120MM (NEEDLE) ×1 IMPLANT
OBTURATOR OPTICALSTD 8 DVNC (TROCAR) ×1 IMPLANT
PACK LAP CHOLE LZT030E (CUSTOM PROCEDURE TRAY) ×1 IMPLANT
PENCIL HANDSWITCHING (ELECTRODE) ×1 IMPLANT
POSITIONER HEAD 8X9X4 ADT (SOFTGOODS) ×1 IMPLANT
SCISSORS MNPLR CVD DVNC XI (INSTRUMENTS) ×1 IMPLANT
SEAL UNIV 5-12 XI (MISCELLANEOUS) ×3 IMPLANT
SET BASIN LINEN APH (SET/KITS/TRAYS/PACK) ×1 IMPLANT
SET TUBE SMOKE EVAC HIGH FLOW (TUBING) ×1 IMPLANT
SUT MNCRL AB 4-0 PS2 18 (SUTURE) ×1 IMPLANT
SUT STRATA 3-0 SH (SUTURE) ×2 IMPLANT
SUT VICRYL 0 UR6 27IN ABS (SUTURE) IMPLANT
SUTURE STRATFX 0 PDS+ CT-2 23 (SUTURE) ×1 IMPLANT
SYR 30ML LL (SYRINGE) ×1 IMPLANT
SYSTEM RETRIEVL 5MM INZII UNIV (BASKET) IMPLANT
TAPE TRANSPORE STRL 2 31045 (GAUZE/BANDAGES/DRESSINGS) ×1 IMPLANT
TRAY FOLEY W/BAG SLVR 16FR ST (SET/KITS/TRAYS/PACK) ×1 IMPLANT
WATER STERILE IRR 500ML POUR (IV SOLUTION) ×1 IMPLANT

## 2024-08-12 NOTE — Op Note (Addendum)
 [name] Operative Note  08/12/24  Preoperative Diagnosis: Incarcerated umbilical hernia   Postoperative Diagnosis: Incarcerated umbilical hernia, 5 cm   Procedure(s) Performed: Robotic assisted laparoscopic ventral hernia with mesh, 5 cm defect size    Surgeon: Dorothyann Brittle, DO   Assistants: No qualified resident was available    Anesthesia: General endotracheal   Anesthesiologist: Landry Dunnings, MD    Specimens: None   Estimated Blood Loss: Minimal   Blood Replacement: None    Complications: None   Wound Class: Clean   Operative Indications: Patient is a 53 year old female who presents for an umbilical hernia repair.  The hernia has been present for at least 7 years, and it has been increasing in size and intermittently causing her discomfort.  She would like it repaired at this time.  All risks and benefits of performing this procedure were discussed with the patient including pain, infection, bleeding, damage to the surrounding structures, need for more procedures or surgery, and hernia recurrence. The patient voiced understanding of the procedure, all questions were sought and answered, and consent was obtained.  Findings: 5 cm umbilical hernia defect containing omentum Tension free repair achieved with Ventralight 4.5 inch mesh and suture Adequate hemostasis    Procedure: The patient was brought to the operating room and general anesthesia was induced. A time-out was completed verifying correct patient, procedure, site, positioning, and implant(s) and/or special equipment prior to beginning this procedure. Antibiotics were administered prior to making the incision. SCDs placed. The anterior abdominal wall was prepped and draped in the standard sterile fashion.   A stab incision was made at Palmer's point. A towel clip was used to elevate the abdominal wall. A Veress needle placed and the saline drop test was performed. The abdomen was  insufflated to 15cm without issues. 8 mm port was then placed at the Palmer's point incision using the optiview technique.  No injuries were noted from insufflation and port insertion.  2 additional lateral ports on the left side were placed using the optiview ports, placing the 8mm central camera port and an additional 8mm port on in the left lower quadrant with adequate space between.  The [place] robot then docked into place.  The hernia was noted to be umbilical containing omentum. The hernia contents noted and reduced with combination of blunt, sharp dissection with scissors and fenestrated forceps.  Hemostasis achieved throughout this portion.  Once all hernia contents reduced, there was noted to be a 5 cm hernia defect.  Any fat overlying the peritoneal layer was taken down creating flaps to clear the space for adequate fixation of the mesh.   The Ventralight 4.5 inch mesh and sutures were placed in the abdominal cavity under direct visualization.  A transfacial suture with 0 stratafix used to primarily close defect under minimal tension. The stratafix was then passed through the center of the mesh (coated side out) to secured the mesh to the abdominal wall centered over the defect.  The mesh was then circumferentially sutured into the anterior abdominal wall using 3-0 stratafix x3.  Any bleeding noted during this portion was no longer actively bleeding by end of securing mesh and tightening the suture.    The robot was undocked.  Excess fat removed from the abdominal wall was removed via Endocatch.  The abdomen was then de-sufflated and the ports were removed.  The fascia of the Palmer's point incision was closed with 0 Vicryl with a PMI needle.  Marcaine was instilled at the  incision sites.  All skin incisions closed with subcuticular 4-0 Monocryl and dermabond. Final inspection revealed acceptable hemostasis.   All counts were correct at the end of the case. The patient was awakened from anesthesia and  extubated without complication.  The patient went to the PACU in stable condition.   Dorothyann Brittle, DO [facility] [address] [place], [place] [address] [phone] (office)

## 2024-08-12 NOTE — Discharge Instructions (Addendum)
 Ambulatory Surgery Discharge Instructions  General Anesthesia or Sedation Do not drive or operate heavy machinery for 24 hours.  Do not consume alcohol, tranquilizers, sleeping medications, or any non-prescribed medications for 24 hours. Do not make important decisions or sign any important papers in the next 24 hours. You should have someone with you tonight at home.  Activity  You are advised to go directly home from the hospital.  Restrict your activities and rest for a day.  Resume light activity tomorrow. No heavy lifting over 10 lbs or strenuous exercise.  Wear your abdominal binder at all times except while sleeping and bathing for the next 1-2 week.  Fluids and Diet Begin with clear liquids, bouillon, dry toast, soda crackers.  If not nauseated, you may go to a regular diet when you desire.  Greasy and spicy foods are not advised.  Medications  If you have not had a bowel movement in 24 hours, take 2 tablespoons over the counter Milk of mag.             You May resume your blood thinners tomorrow (Aspirin , coumadin, or other).  You are being discharged with prescriptions for Opioid/Narcotic Medications: There are some specific considerations for these medications that you should know. Opioid Meds have risks & benefits. Addiction to these meds is always a concern with prolonged use Take medication only as directed Do not drive while taking narcotic pain medication Do not crush tablets or capsules Do not use a different container than medication was dispensed in Lock the container of medication in a cool, dry place out of reach of children and pets. Opioid medication can cause addiction Do not share with anyone else (this is a felony) Do not store medications for future use. Dispose of them properly.     Disposal:  Find a [facility]  household drug take back site near you.  If you can't get to a drug take back site, use the recipe below as a last resort to dispose of expired,  unused or unwanted drugs. Disposal  (Do not dispose chemotherapy drugs this way, talk to your prescribing doctor instead.) Step 1: Mix drugs (do not crush) with dirt, kitty litter, or used coffee grounds and add a small amount of water to dissolve any solid medications. Step 2: Seal drugs in plastic bag. Step 3: Place plastic bag in trash. Step 4: Take prescription container and scratch out personal information, then recycle or throw away.  Operative Site  You have a liquid bandage over your incisions, this will begin to flake off in about a week. Ok to English as a second language teacher. Keep wound clean and dry. No baths or swimming. No lifting more than 10 pounds.  Contact Information: If you have questions or concerns, please call our office, [phone], Monday- Thursday 8AM-5PM and Friday 8AM-12Noon.  If it is after hours or on the weekend, please call [facility] Main [facility], [phone], and ask to speak to the surgeon on call for Dr. Evonnie at [facility].   SPECIFIC COMPLICATIONS TO WATCH FOR: Inability to urinate Fever over 101? F by mouth Nausea and vomiting lasting longer than 24 hours. Pain not relieved by medication ordered Swelling around the operative site Increased redness, warmth, hardness, around operative area Numbness, tingling, or cold fingers or toes Blood -soaked dressing, (small amounts of oozing may be normal) Increasing and progressive drainage from surgical area or exam site  Post Anesthesia Home Care Instructions  Activity: Get plenty of rest for the remainder of the day.  A responsible individual must stay with you for 24 hours following the procedure.  For the next 24 hours, DO NOT: -Drive a car -Advertising copywriter -Drink alcoholic beverages -Take any medication unless instructed by your physician -Make any legal decisions or sign important papers.  Meals: Start with liquid foods such as gelatin or soup. Progress to regular foods as tolerated. Avoid greasy, spicy, heavy  foods. If nausea and/or vomiting occur, drink only clear liquids until the nausea and/or vomiting subsides. Call your physician if vomiting continues.  Special Instructions/Symptoms: Your throat may feel dry or sore from the anesthesia or the breathing tube placed in your throat during surgery. If this causes discomfort, gargle with warm salt water. The discomfort should disappear within 24 hours.  If you had a scopolamine patch placed behind your ear for the management of post- operative nausea and/or vomiting:  1. The medication in the patch is effective for 72 hours, after which it should be removed.  Wrap patch in a tissue and discard in the trash. Wash hands thoroughly with soap and water. 2. You may remove the patch earlier than 72 hours if you experience unpleasant side effects which may include dry mouth, dizziness or visual disturbances. 3. Avoid touching the patch. Wash your hands with soap and water after contact with the patch.

## 2024-08-12 NOTE — Progress Notes (Signed)
 [facility]  Spoke with the patient's friend in the consultation room.  I explained that she tolerated the procedure without difficulty.  She has dissolvable stitches under the skin with overlying skin glue.  This will flake off in 10 to 14 days.  I discharged her home with a prescription for narcotic pain medication that they should take as needed for pain.  I also want her taking scheduled Tylenol .  If they take the narcotic pain medication, they should take a stool softener as well.  The patient will follow-up with me in 2 weeks.  All questions were answered to her expressed satisfaction.  Dorothyann Brittle, DO [facility] [address] [place], [place] [address] [phone] (office)

## 2024-08-12 NOTE — Anesthesia Preprocedure Evaluation (Addendum)
 Anesthesia Evaluation  Patient identified by MRN, date of birth, ID band Patient awake    Reviewed: Allergy & Precautions, H&P , NPO status , Patient's Chart, lab work & pertinent test results, reviewed documented beta blocker date and time   History of Anesthesia Complications (+) PONV and history of anesthetic complications  Airway Mallampati: II  TM Distance: >3 FB Neck ROM: full    Dental  (+) Edentulous Upper, Edentulous Lower   Pulmonary asthma , sleep apnea    Pulmonary exam normal breath sounds clear to auscultation       Cardiovascular Exercise Tolerance: Good + DOE  Normal cardiovascular exam Rhythm:regular Rate:Normal     Neuro/Psych  Headaches PSYCHIATRIC DISORDERS Anxiety Depression Bipolar Disorder      GI/Hepatic ,GERD  ,,(+)     substance abuse  cocaine use  Endo/Other  diabetes, Type 2  Class 3 obesity  Renal/GU negative Renal ROS  negative genitourinary   Musculoskeletal   Abdominal  (+) + obese  Peds  Hematology negative hematology ROS (+)   Anesthesia Other Findings   Reproductive/Obstetrics negative OB ROS                              Anesthesia Physical Anesthesia Plan  ASA: 3  Anesthesia Plan: General   Post-op Pain Management: Dilaudid IV   Induction: Intravenous  PONV Risk Score and Plan: Ondansetron , Dexamethasone, Midazolam and Scopolamine patch - Pre-op  Airway Management Planned: Oral ETT  Additional Equipment: None  Intra-op Plan:   Post-operative Plan: Extubation in OR  Informed Consent: I have reviewed the patients History and Physical, chart, labs and discussed the procedure including the risks, benefits and alternatives for the proposed anesthesia with the patient or authorized representative who has indicated his/her understanding and acceptance.     Dental Advisory Given  Plan Discussed with: CRNA  Anesthesia Plan Comments:           Anesthesia Quick Evaluation

## 2024-08-12 NOTE — Anesthesia Postprocedure Evaluation (Signed)
 Anesthesia Post Note  Patient: Sherry Cole  Procedure(s) Performed: REPAIR, HERNIA, UMBILICAL, ROBOT-ASSISTED, WITH MESH (Abdomen)  Patient location during evaluation: PACU Anesthesia Type: General Level of consciousness: awake and alert Pain management: pain level controlled Vital Signs Assessment: post-procedure vital signs reviewed and stable Respiratory status: spontaneous breathing, nonlabored ventilation, respiratory function stable and patient connected to nasal cannula oxygen  Cardiovascular status: blood pressure returned to baseline and stable Postop Assessment: no apparent nausea or vomiting Anesthetic complications: no   There were no known notable events for this encounter.   Last Vitals:  Vitals:   08/12/24 0939 08/12/24 1445  BP: (!) 134/91 (!) 145/86  Pulse: 73 80  Resp:  19  Temp: 36.9 C 36.9 C  SpO2: 98% 97%    Last Pain:  Vitals:   08/12/24 1453  TempSrc:   PainSc: 9                  Santoria Chason L Jahzion Brogden

## 2024-08-12 NOTE — H&P (Signed)
 [name] History and Physical   Reason for Referral: Umbilical hernia reschedule surgery Referring Physician: Dr. Katrinka   Chief Complaint   Sherry Cole        Sherry Cole is Cole 53 y.o. female.  HPI: Patient being evaluated for an umbilical hernia and to reschedule her surgery.  She was previously scheduled for robotic assisted laparoscopic umbilical hernia repair with mesh on 07/22/2023, however she was noted to be positive for cocaine on urine drug test, which resulted in cancellation of her surgery.  Her umbilical hernia has been present for at least 7 years, and she first noticed it when she was pregnant 7 years ago.  She was supposed to undergo Cole hernia repair at the time of her C-section, however secondary to preeclampsia, the hernia was not repaired at the time of C-section.  Since last year, her hernia has slowly been increasing in size.  She has occasional discomfort associated with it, but otherwise has been doing well related to the hernia.  She denies nausea and vomiting.  She is having regular bowel movements.  Her past medical history is significant for neuropathy, diabetes, depression, bipolar disorder, schizophrenia, hyperlipidemia, joint pain, and restless leg syndrome.  She denies use of blood thinning medications.  Her surgical history is significant for 2 C-sections.  She denies use of tobacco products, alcohol, and illicit drugs.       Past Medical History:  Diagnosis Date   Asthma     Bipolar 1 disorder (HCC)     Depression     Diabetes mellitus without complication (HCC)     History of kidney stones     Hydrocephalus (HCC)     Migraine     Paranoid schizophrenia (HCC)     Personality disorder (HCC)     Psychotic disorder (HCC)     PTSD (post-traumatic stress disorder)     Schwannoma             Past Surgical History:  Procedure Laterality Date   CESAREAN SECTION        x2   TUBAL LIGATION               Family History  Problem  Relation Age of Onset   Heart disease Maternal Grandmother     Cancer Maternal Grandmother     Hyperlipidemia Maternal Grandmother     COPD Maternal Grandmother     Hyperlipidemia Maternal Grandfather     Heart disease Mother        Social History         Tobacco Use   Smoking status: Never   Smokeless tobacco: Never  Vaping Use   Vaping status: Never Used  Substance Use Topics   Alcohol use: No   Drug use: Not Currently      Types: Cocaine, Marijuana      Comment: pt denies      Medications: I have reviewed the patient's current medications. Allergies as of 06/21/2024         Reactions    Metformin Hcl Diarrhea    Other Other (See Comments)    Lillies and roses-migraines Mammal dander-sneezing and asthma    Valacyclovir Hives            Medication List           Accurate as of June 21, 2024 11:59 PM. If you have any questions, ask your nurse or doctor.  STOP taking these medications     metroNIDAZOLE  500 MG tablet Commonly known as: FLAGYL  Stopped by: Sherry Cole           TAKE these medications     acetaminophen  500 MG tablet Commonly known as: TYLENOL  Take 500-1,000 mg by mouth every 6 (six) hours as needed (pain.).    Advair Diskus 100-50 MCG/ACT Aepb Generic drug: fluticasone -salmeterol 1 puff.    albuterol  108 (90 Base) MCG/ACT inhaler Commonly known as: VENTOLIN  HFA Inhale 2 puffs into the lungs every 4 (four) hours as needed for wheezing or shortness of breath.    Albuterol  Sulfate 2.5 MG/0.5ML Nebu Every 4 hours as needed for shortness of breath Inhalation; Duration: 30 days    atorvastatin 40 MG tablet Commonly known as: LIPITOR Take 40 mg by mouth daily.    B-complex with vitamin C tablet Take 1 tablet by mouth daily.    benzonatate 100 MG capsule Commonly known as: TESSALON 1 capsule as needed Orally Three times Cole day; Duration: 7 days As needed for cough    BLOOD GLUCOSE TEST STRIPS Strp use  twice Cole day to check blook sugars In Vitro twice Cole day; Duration: 30 days    Contour Blood Glucose System w/Device Kit as directed; Duration: 30 days    diphenhydrAMINE  25 MG tablet Commonly known as: BENADRYL  Take 25 mg by mouth every 6 (six) hours as needed (animal allergies.).    ferrous sulfate 325 (65 FE) MG tablet Take 325 mg by mouth daily.    FLUoxetine  20 MG capsule Commonly known as: PROZAC  Take 20 mg by mouth at bedtime.    furosemide  20 MG tablet Commonly known as: LASIX  Take 1 tablet (20 mg total) by mouth daily.    gabapentin 400 MG capsule Commonly known as: NEURONTIN Take 400 mg by mouth 3 (three) times daily.    guanFACINE 1 MG tablet Commonly known as: TENEX Take 1 mg by mouth at bedtime.    hydrOXYzine  50 MG capsule Commonly known as: VISTARIL  TAKE ONE CAPSULE BY MOUTH THREE TIMES DAILY AS NEEDED FOR ANXIETY for 30 days    Lancets Super Thin Misc as directed; Duration: 30 days    multivitamin with minerals Tabs tablet Take 1 tablet by mouth daily.    Ozempic (2 MG/DOSE) 8 MG/3ML Sopn Generic drug: Semaglutide (2 MG/DOSE) Inject 2 mg into the skin once Cole week. What changed: Another medication with the same name was removed. Continue taking this medication, and follow the directions you see here. Changed by: Sherry Cole    potassium chloride  SA 20 MEQ tablet Commonly known as: KLOR-CON  M Take 1 tablet (20 mEq total) by mouth 2 (two) times daily.    PROBIOTIC PO Take 1 tablet by mouth daily. Gummy    progesterone  200 MG capsule Commonly known as: Prometrium  Take 1 capsule (200 mg total) by mouth daily.    traZODone 100 MG tablet Commonly known as: DESYREL Take 100 mg by mouth at bedtime as needed for sleep.    VITAMIN D3 PO Take 1 tablet by mouth daily.    Vraylar 3 MG capsule Generic drug: cariprazine Take by mouth daily.               ROS:  Constitutional: negative for chills, fatigue, and fevers Eyes: negative for  visual disturbance and pain Ears, nose, mouth, throat, and face: negative for ear drainage, sore throat, and sinus problems Respiratory: negative for cough, wheezing, and shortness of breath Cardiovascular:  negative for chest pain and palpitations Gastrointestinal: negative for abdominal pain, nausea, reflux symptoms, and vomiting Genitourinary:negative for dysuria and frequency Integument/breast: negative for dryness and rash Hematologic/lymphatic: negative for bleeding and lymphadenopathy Musculoskeletal:negative for back pain and neck pain Neurological: negative for dizziness and tremors Endocrine: negative for temperature intolerance   Blood pressure 99/68, pulse 74, temperature 98.1 F (36.7 C), temperature source Oral, resp. rate 16, height 5' 4 (1.626 m), weight 285 lb 6.4 oz (129.5 kg), last menstrual period 05/31/2024, SpO2 93%. Physical Exam Vitals reviewed.  Constitutional:      Appearance: Normal appearance.  HENT:     Head: Normocephalic and atraumatic.  Eyes:     Extraocular Movements: Extraocular movements intact.     Pupils: Pupils are equal, round, and reactive to light.  Cardiovascular:     Rate and Rhythm: Normal rate and regular rhythm.  Pulmonary:     Effort: Pulmonary effort is normal.     Breath sounds: Normal breath sounds.  Abdominal:     Comments: Abdomen soft, nondistended, no percussion tenderness, nontender to palpation; no rigidity, guarding, rebound tenderness; partially reducible umbilical hernia, minimal tenderness to palpation  Musculoskeletal:        General: Normal range of motion.     Cervical back: Normal range of motion.  Skin:    General: Skin is warm and dry.  Neurological:     General: No focal deficit present.     Mental Status: She is alert and oriented to person, place, and time.  Psychiatric:        Mood and Affect: Mood normal.        Behavior: Behavior normal.       Results: No results found for this or any previous Cole  (from the past 48 hours).   No results found.     Assessment & Plan:  Sherry Cole is Cole 53 y.o. female who presents for evaluation of an umbilical hernia.   -I discussed the pathophysiology of umbilical hernias and we discussed the recommendations for surgical repair -The risk and benefits of robotic assisted laparoscopic umbilical hernia repair with mesh were discussed including but not limited to bleeding, infection, injury to surrounding structures, need for additional procedures, and hernia recurrence.  After careful consideration, Sherry Cole has decided to proceed with surgery.  -Patient tentatively scheduled for surgery on 10/3 -Information provided to the patient regarding umbilical hernia -Advised that the patient should present to the ED if they begin to have painful nonreducible bulge at her umbilicus, nausea, vomiting, and obstipation   All questions were answered to the satisfaction of the patient.   Note: Portions of this report may have been transcribed using voice recognition software. Every effort has been made to ensure accuracy; however, inadvertent computerized transcription errors may still be present.    Sherry Brittle, DO [facility] [address] [place], [place] [address] [phone] (office)

## 2024-08-12 NOTE — Anesthesia Procedure Notes (Signed)
 Procedure Name: Intubation Date/Time: 08/12/2024 12:20 PM  Performed by: Augusta Daved SAILOR, CRNAPre-anesthesia Checklist: Patient identified, Emergency Drugs available, Suction available and Patient being monitored Patient Re-evaluated:Patient Re-evaluated prior to induction Oxygen  Delivery Method: Circle System Utilized Preoxygenation: Pre-oxygenation with 100% oxygen  Induction Type: IV induction Laryngoscope Size: Miller and 2 Grade View: Grade II Tube type: Oral Tube size: 7.0 mm Number of attempts: 1 Airway Equipment and Method: Stylet and Oral airway Placement Confirmation: ETT inserted through vocal cords under direct vision, positive ETCO2 and breath sounds checked- equal and bilateral Secured at: 22 (at the lip) cm Tube secured with: Tape Dental Injury: Teeth and Oropharynx as per pre-operative assessment

## 2024-08-12 NOTE — Transfer of Care (Signed)
 Immediate Anesthesia Transfer of Care Note  Patient: Sherry Cole  Procedure(s) Performed: REPAIR, HERNIA, UMBILICAL, ROBOT-ASSISTED, WITH MESH (Abdomen)  Patient Location: PACU  Anesthesia Type:General  Level of Consciousness: awake, oriented, drowsy, and patient cooperative  Airway & Oxygen  Therapy: Patient Spontanous Breathing and Patient connected to face mask oxygen   Post-op Assessment: Report given to RN and Post -op Vital signs reviewed and stable  Post vital signs: Reviewed and stable  Last Vitals:  Vitals Value Taken Time  BP 145/86 08/12/24 14:45  Temp 36.9 C 08/12/24 14:45  Pulse 79 08/12/24 14:48  Resp 16 08/12/24 14:48  SpO2 97 % 08/12/24 14:48  Vitals shown include unfiled device data.  Last Pain:  Vitals:   08/12/24 1011  TempSrc:   PainSc: 0-No pain      Patients Stated Pain Goal: 6 (08/12/24 0939)  Complications: No notable events documented.

## 2024-08-22 ENCOUNTER — Ambulatory Visit (HOSPITAL_COMMUNITY)
Admission: RE | Admit: 2024-08-22 | Discharge: 2024-08-22 | Disposition: A | Discharge status: Home / Self Care | Source: Ambulatory Visit | Attending: Primary Care | Admitting: Primary Care

## 2024-08-22 DIAGNOSIS — R911 Solitary pulmonary nodule: Secondary | ICD-10-CM | POA: Insufficient documentation

## 2024-08-30 ENCOUNTER — Encounter: Payer: Self-pay | Admitting: Surgery

## 2024-08-30 ENCOUNTER — Ambulatory Visit: Admitting: Surgery

## 2024-08-30 VITALS — BP 97/63 | HR 83 | Temp 97.8°F | Resp 16 | Ht 64.0 in | Wt 281.0 lb

## 2024-08-30 DIAGNOSIS — Z09 Encounter for follow-up examination after completed treatment for conditions other than malignant neoplasm: Secondary | ICD-10-CM

## 2024-08-30 NOTE — Progress Notes (Signed)
 [facility] Note   HPI:  53 y.o. Female presents to clinic for post-op follow-up s/p robotic assisted laparoscopic ventral hernia repair with mesh on 10/3.  She has been doing well.  She has some tenderness at her left upper quadrant incision site, but this has been improving with time.  She is tolerating a diet without nausea and vomiting, and she is moving her bowels without issue.  She denies issues with her incision sites.  She denies fever and chills.  Review of Systems:  All other review of systems: otherwise negative   Vital Signs:  LMP 07/14/2024 Comment: Urine pregnancy negative on 08/12/2024   Physical Exam:  Physical Exam Vitals reviewed.  Constitutional:      Appearance: Normal appearance.  Abdominal:     Comments: Abdomen soft, nondistended, no percussion tenderness, nontender to palpation; no rigidity, guarding, or rebound tenderness; incision healing well  Neurological:     Mental Status: She is alert.     Laboratory studies: None   Imaging:  None   Assessment:  53 y.o. yo Female who presents for follow-up s/p robotic assisted laparoscopic ventral hernia repair with mesh on 10/3.   Plan:  -Patient doing well since the surgery.  Tolerating a diet, moving her bowels, and pain well controlled  -Advised that she needs to continue to take it easy with no lifting >10 pounds for 4 weeks after surgery.  She should slowly start to increase her activity once she is 4 weeks out  -Follow up with me as needed  All of the above recommendations were discussed with the patient, and all of patient's questions were answered to her expressed satisfaction.  Note: Portions of this report may have been transcribed using voice recognition software. Every effort has been made to ensure accuracy; however, inadvertent computerized transcription errors may still be present.   Dorothyann Brittle, DO [facility] [address] [place], [place] [address] [phone] (office)

## 2024-09-06 ENCOUNTER — Ambulatory Visit: Payer: Self-pay | Admitting: Primary Care

## 2024-09-06 DIAGNOSIS — R911 Solitary pulmonary nodule: Secondary | ICD-10-CM

## 2024-09-06 NOTE — Progress Notes (Signed)
 Called and spoke to pt - advised of CT results per Landry Ferrari, NP. Pt verbalized understanding, NFN. Order placed for Chest CT in 62mo.

## 2024-11-03 ENCOUNTER — Other Ambulatory Visit: Payer: Self-pay | Admitting: Medical Genetics

## 2024-11-14 ENCOUNTER — Other Ambulatory Visit (HOSPITAL_COMMUNITY)

## 2025-03-07 ENCOUNTER — Other Ambulatory Visit
